# Patient Record
Sex: Female | Born: 2005 | Race: White | Hispanic: No | Marital: Single | Smoking: Never smoker
Health system: Southern US, Community
[De-identification: ages and names within clinical notes are randomized; demographics above are authoritative.]

## PROBLEM LIST (undated history)

## (undated) DIAGNOSIS — I499 Cardiac arrhythmia, unspecified: Secondary | ICD-10-CM

## (undated) DIAGNOSIS — I493 Ventricular premature depolarization: Secondary | ICD-10-CM

## (undated) HISTORY — DX: Cardiac arrhythmia, unspecified: I49.9

## (undated) HISTORY — PX: APPENDECTOMY: SHX54

## (undated) HISTORY — DX: Ventricular premature depolarization: I49.3

---
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
## 2023-02-25 ENCOUNTER — Emergency Department (HOSPITAL_COMMUNITY): Payer: Medicaid Other

## 2023-02-25 ENCOUNTER — Other Ambulatory Visit: Payer: Self-pay

## 2023-02-25 ENCOUNTER — Emergency Department (HOSPITAL_COMMUNITY)
Admission: EM | Admit: 2023-02-25 | Discharge: 2023-02-25 | Disposition: A | Discharge status: Home / Self Care | Payer: Medicaid Other | Attending: Emergency Medicine | Admitting: Emergency Medicine

## 2023-02-25 ENCOUNTER — Encounter (HOSPITAL_COMMUNITY): Payer: Self-pay

## 2023-02-25 DIAGNOSIS — R1030 Lower abdominal pain, unspecified: Secondary | ICD-10-CM | POA: Insufficient documentation

## 2023-02-25 DIAGNOSIS — R109 Unspecified abdominal pain: Secondary | ICD-10-CM | POA: Diagnosis not present

## 2023-02-25 DIAGNOSIS — R112 Nausea with vomiting, unspecified: Secondary | ICD-10-CM

## 2023-02-25 DIAGNOSIS — R1013 Epigastric pain: Secondary | ICD-10-CM | POA: Insufficient documentation

## 2023-02-25 LAB — CBC
HCT: 49.1 % — ABNORMAL HIGH (ref 36.0–46.0)
Hemoglobin: 16.9 g/dL — ABNORMAL HIGH (ref 12.0–15.0)
MCH: 30.7 pg (ref 26.0–34.0)
MCHC: 34.4 g/dL (ref 30.0–36.0)
MCV: 89.1 fL (ref 80.0–100.0)
Platelets: 213 10*3/uL (ref 150–400)
RBC: 5.51 MIL/uL — ABNORMAL HIGH (ref 3.87–5.11)
RDW: 11.7 % (ref 11.5–15.5)
WBC: 10.6 10*3/uL — ABNORMAL HIGH (ref 4.0–10.5)
nRBC: 0 % (ref 0.0–0.2)

## 2023-02-25 LAB — COMPREHENSIVE METABOLIC PANEL
ALT: 17 U/L (ref 0–44)
AST: 21 U/L (ref 15–41)
Albumin: 5.5 g/dL — ABNORMAL HIGH (ref 3.5–5.0)
Alkaline Phosphatase: 58 U/L (ref 38–126)
Anion gap: 14 (ref 5–15)
BUN: 11 mg/dL (ref 6–20)
CO2: 21 mmol/L — ABNORMAL LOW (ref 22–32)
Calcium: 9.8 mg/dL (ref 8.9–10.3)
Chloride: 103 mmol/L (ref 98–111)
Creatinine, Ser: 0.92 mg/dL (ref 0.44–1.00)
GFR, Estimated: >60 mL/min (ref >60)
Glucose, Bld: 107 mg/dL — ABNORMAL HIGH (ref 70–99)
Potassium: 3.6 mmol/L (ref 3.5–5.1)
Sodium: 138 mmol/L (ref 135–145)
Total Bilirubin: 1.1 mg/dL (ref 0.3–1.2)
Total Protein: 8.8 g/dL — ABNORMAL HIGH (ref 6.5–8.1)

## 2023-02-25 LAB — URINALYSIS, ROUTINE W REFLEX MICROSCOPIC
Bilirubin Urine: NEGATIVE
Glucose, UA: NEGATIVE mg/dL
Hgb urine dipstick: NEGATIVE
Ketones, ur: 20 mg/dL — AB
Leukocytes,Ua: NEGATIVE
Nitrite: NEGATIVE
Protein, ur: 30 mg/dL — AB
Specific Gravity, Urine: 1.03 (ref 1.005–1.030)
pH: 5 (ref 5.0–8.0)

## 2023-02-25 LAB — POC URINE PREG, ED: Preg Test, Ur: NEGATIVE

## 2023-02-25 LAB — LIPASE, BLOOD: Lipase: 32 U/L (ref 11–51)

## 2023-02-25 MED ORDER — SODIUM CHLORIDE 0.9 % IV BOLUS
1000.0000 mL | Freq: Once | INTRAVENOUS | Status: AC
  Administered 2023-02-25: 1000 mL via INTRAVENOUS

## 2023-02-25 MED ORDER — ONDANSETRON HCL 4 MG PO TABS: 4.0000 mg | ORAL_TABLET | Freq: Four times a day (QID) | ORAL | 0 refills | Status: DC

## 2023-02-25 MED ORDER — IOHEXOL 300 MG/ML  SOLN
80.0000 mL | Freq: Once | INTRAMUSCULAR | Status: AC | PRN
  Administered 2023-02-25: 80 mL via INTRAVENOUS

## 2023-02-25 MED ORDER — ONDANSETRON HCL 4 MG/2ML IJ SOLN
4.0000 mg | Freq: Once | INTRAMUSCULAR | Status: AC
  Administered 2023-02-25: 4 mg via INTRAVENOUS
  Filled 2023-02-25: qty 2

## 2023-02-25 NOTE — ED Triage Notes (Signed)
Pt c/o emesis since last night. Endorses abdominal pain. States she can not keep anything down and is shaky.

## 2023-02-25 NOTE — Discharge Instructions (Signed)
Small, frequent sips of clear fluids tonight, if improving, bland diet as tolerated starting tomorrow.  Follow-up with your primary care provider for recheck or return to the emergency department for any new or worsening symptoms.

## 2023-02-25 NOTE — ED Provider Notes (Signed)
[place] EMERGENCY DEPARTMENT AT [facility] Provider Note   CSN: [id number] Arrival date & time: 02/25/23  1958     History  Chief Complaint  Patient presents with   Emesis    Sandy Burch is a 17 y.o. female.   Emesis Associated symptoms: abdominal pain   Associated symptoms: no chills, no cough, no diarrhea, no fever and no headaches        Sandy Burch is a 17 y.o. female who presents to the Emergency Department complaining of sudden onset nausea vomiting and lower abdominal pain.  She reports multiple episodes of vomiting and persistent nausea since her symptoms began last evening.  Unable to tolerate any solid foods or liquids.  She describes having cramping pain of her lower abdomen.  She denies any constipation or diarrhea, fever or chills, dysuria, hematemesis, bloody or black stools.    Home Medications Prior to Admission medications   Medication Sig Start Date End Date Taking? Authorizing Provider  doxycycline (VIBRAMYCIN) 100 MG capsule Take 1 capsule (100 mg total) by mouth 2 (two) times daily. 10/01/22   Gareth Eagle, PA-C  esomeprazole (NEXIUM) 20 MG capsule Take 1 capsule (20 mg total) by mouth daily. 09/09/22   Berna Bue, MD      Allergies    Patient has no known allergies.    Review of Systems   Review of Systems  Constitutional:  Negative for appetite change, chills and fever.  Respiratory:  Negative for cough and shortness of breath.   Cardiovascular:  Negative for chest pain.  Gastrointestinal:  Positive for abdominal pain and vomiting. Negative for diarrhea and nausea.  Genitourinary:  Negative for decreased urine volume, dysuria and flank pain.  Musculoskeletal:  Negative for back pain.  Skin:  Negative for rash.  Neurological:  Positive for weakness. Negative for dizziness, numbness and headaches.    Physical Exam Updated Vital Signs BP 106/85   Pulse 90   Temp 98.4 F (36.9 C)   Resp 18   Ht 5\' 3"  (1.6 m)    Wt 63.5 kg   SpO2 97%   BMI 24.80 kg/m  Physical Exam Vitals and nursing note reviewed.  Constitutional:      General: She is not in acute distress.    Appearance: Normal appearance. She is ill-appearing.  HENT:     Mouth/Throat:     Mouth: Mucous membranes are moist.  Cardiovascular:     Rate and Rhythm: Normal rate and regular rhythm.     Pulses: Normal pulses.  Pulmonary:     Effort: Pulmonary effort is normal.  Abdominal:     General: There is no distension.     Palpations: Abdomen is soft.     Tenderness: There is abdominal tenderness. There is no right CVA tenderness, left CVA tenderness or guarding.     Comments: Mild tenderness palpation of the epigastric and lower abdomen.  No guarding or rebound tenderness.  Abdomen is soft  Musculoskeletal:        General: Normal range of motion.  Skin:    General: Skin is warm.     Capillary Refill: Capillary refill takes less than 2 seconds.  Neurological:     General: No focal deficit present.     Mental Status: She is alert.     Sensory: No sensory deficit.     Motor: No weakness.     ED Results / Procedures / Treatments   Labs (all labs ordered are listed, but only abnormal  results are displayed) Labs Reviewed  COMPREHENSIVE METABOLIC PANEL - Abnormal; Notable for the following components:      Result Value   CO2 21 (*)    Glucose, Bld 107 (*)    Total Protein 8.8 (*)    Albumin 5.5 (*)    All other components within normal limits  CBC - Abnormal; Notable for the following components:   WBC 10.6 (*)    RBC 5.51 (*)    Hemoglobin 16.9 (*)    HCT 49.1 (*)    All other components within normal limits  LIPASE, BLOOD  URINALYSIS, ROUTINE W REFLEX MICROSCOPIC  POC URINE PREG, ED    EKG None  Radiology CT ABDOMEN PELVIS W CONTRAST  Result Date: 02/25/2023 CLINICAL DATA:  Right-sided abdominal pain, history of appendectomy EXAM: CT ABDOMEN AND PELVIS WITH CONTRAST TECHNIQUE: Multidetector CT imaging of the abdomen  and pelvis was performed using the standard protocol following bolus administration of intravenous contrast. RADIATION DOSE REDUCTION: This exam was performed according to the departmental dose-optimization program which includes automated exposure control, adjustment of the mA and/or kV according to patient size and/or use of iterative reconstruction technique. CONTRAST:  80mL OMNIPAQUE IOHEXOL 300 MG/ML  SOLN COMPARISON:  None Available. FINDINGS: Lower chest: No acute abnormality. Hepatobiliary: No focal liver abnormality is seen. No gallstones, gallbladder wall thickening, or biliary dilatation. Pancreas: Unremarkable. No pancreatic ductal dilatation or surrounding inflammatory changes. Spleen: Normal in size without focal abnormality. Adrenals/Urinary Tract: Adrenal glands are within normal limits. Kidneys are unremarkable. No renal calculi or obstructive changes are seen. Bladder is decompressed. Stomach/Bowel: The appendix is surgically removed. No obstructive or inflammatory changes of the colon are seen. Stomach and small bowel are within normal limits. Vascular/Lymphatic: No significant vascular findings are present. No enlarged abdominal or pelvic lymph nodes. Reproductive: Uterus is retroflexed. Involuting cyst in the left adnexa is noted. Other: No abdominal wall hernia or abnormality. No abdominopelvic ascites. Musculoskeletal: No acute or significant osseous findings. IMPRESSION: Small involuting cyst in the left adnexa. No other focal abnormality is noted. Electronically Signed   By: Alcide CleverMark  Lukens M.D.   On: 02/25/2023 22:58    Procedures Procedures    Medications Ordered in ED Medications  sodium chloride 0.9 % bolus 1,000 mL (has no administration in time range)  ondansetron (ZOFRAN) injection 4 mg (has no administration in time range)    ED Course/ Medical Decision Making/ A&P                             Medical Decision Making Patient here for evaluation of lower abdominal pain,  nausea, vomiting for 24 hours.  Unable to tolerate liquids or foods.  No reported fever bloody or black stools or hematemesis.  No known sick contacts.  No new medications or foods  Differential would include but not limited to gastroenteritis, SBO, gallbladder process.  Sepsis also considered but felt less likely given reassuring vital signs and patient is afebrile.  Will give NS and antiemetic, labs and CT abdomen pelvis  Amount and/or Complexity of Data Reviewed Labs: ordered.    Details: Labs interpreted by me, no significant leukocytosis, chemistries without derangement.  Urinalysis without evidence of infection.  Urine pregnancy test is negative.  Lipase unremarkable. Radiology: ordered.    Details: CT abdomen pelvis small involuting cyst of the left index, no other focal abnormality Discussion of management or test interpretation with external provider(s): On recheck, patient resting comfortably.  Feeling better.  She has tolerated oral fluid challenge.  No further vomiting.  Workup today reassuring.  Likely viral process.  Risk Prescription drug management.           Final Clinical Impression(s) / ED Diagnoses Final diagnoses:  Nausea and vomiting, unspecified vomiting type    Rx / DC Orders ED Discharge Orders     None         Rosey Bathriplett, Diannah Rindfleisch, PA-C 02/25/23 2310    Rondel BatonPaterson, Robert C, MD 02/26/23 1630

## 2023-04-01 ENCOUNTER — Encounter: Payer: Self-pay | Admitting: Family Medicine

## 2023-04-01 ENCOUNTER — Other Ambulatory Visit (INDEPENDENT_AMBULATORY_CARE_PROVIDER_SITE_OTHER): Payer: Medicaid Other

## 2023-04-01 ENCOUNTER — Ambulatory Visit (INDEPENDENT_AMBULATORY_CARE_PROVIDER_SITE_OTHER): Payer: Medicaid Other | Admitting: Family Medicine

## 2023-04-01 VITALS — BP 101/54 | HR 64 | Temp 98.7°F | Ht 63.0 in | Wt 146.0 lb

## 2023-04-01 DIAGNOSIS — F321 Major depressive disorder, single episode, moderate: Secondary | ICD-10-CM | POA: Diagnosis not present

## 2023-04-01 DIAGNOSIS — Z136 Encounter for screening for cardiovascular disorders: Secondary | ICD-10-CM

## 2023-04-01 DIAGNOSIS — R1084 Generalized abdominal pain: Secondary | ICD-10-CM | POA: Diagnosis not present

## 2023-04-01 DIAGNOSIS — I499 Cardiac arrhythmia, unspecified: Secondary | ICD-10-CM | POA: Diagnosis not present
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
## 2023-04-02 LAB — VITAMIN D 25 HYDROXY (VIT D DEFICIENCY, FRACTURES): Vit D, 25-Hydroxy: 34.9 ng/mL (ref 30.0–100.0)

## 2023-04-02 LAB — ANEMIA PROFILE B
Basophils Absolute: 0 10*3/uL (ref 0.0–0.2)
Basos: 0 %
EOS (ABSOLUTE): 0 10*3/uL (ref 0.0–0.4)
Eos: 1 %
Ferritin: 61 ng/mL (ref 15–77)
Folate: 4.7 ng/mL (ref >3.0)
Hematocrit: 41.7 % (ref 34.0–46.6)
Hemoglobin: 14.2 g/dL (ref 11.1–15.9)
Immature Grans (Abs): 0 10*3/uL (ref 0.0–0.1)
Immature Granulocytes: 0 %
Iron Saturation: 42 % (ref 15–55)
Iron: 109 ug/dL (ref 27–159)
Lymphocytes Absolute: 1.6 10*3/uL (ref 0.7–3.1)
Lymphs: 31 %
MCH: 30.3 pg (ref 26.6–33.0)
MCHC: 34.1 g/dL (ref 31.5–35.7)
MCV: 89 fL (ref 79–97)
Monocytes Absolute: 0.4 10*3/uL (ref 0.1–0.9)
Monocytes: 8 %
Neutrophils Absolute: 3 10*3/uL (ref 1.4–7.0)
Neutrophils: 60 %
Platelets: 131 10*3/uL — ABNORMAL LOW (ref 150–450)
RBC: 4.68 x10E6/uL (ref 3.77–5.28)
RDW: 12.8 % (ref 11.7–15.4)
Retic Ct Pct: 0.7 % (ref 0.6–2.6)
Total Iron Binding Capacity: 261 ug/dL (ref 250–450)
UIBC: 152 ug/dL (ref 131–425)
Vitamin B-12: 206 pg/mL — ABNORMAL LOW (ref 232–1245)
WBC: 5 10*3/uL (ref 3.4–10.8)

## 2023-04-02 LAB — THYROID PANEL WITH TSH
Free Thyroxine Index: 1.7 (ref 1.2–4.9)
T3 Uptake Ratio: 23 % (ref 23–35)
T4, Total: 7.2 ug/dL (ref 4.5–12.0)
TSH: 1.37 u[IU]/mL (ref 0.450–4.500)

## 2023-04-02 LAB — LIPID PANEL
Chol/HDL Ratio: 2.9 ratio (ref 0.0–4.4)
Cholesterol, Total: 130 mg/dL (ref 100–169)
HDL: 45 mg/dL (ref >39)
LDL Chol Calc (NIH): 70 mg/dL (ref 0–109)
Triglycerides: 72 mg/dL (ref 0–89)
VLDL Cholesterol Cal: 15 mg/dL (ref 5–40)

## 2023-04-02 LAB — CMP14+EGFR
ALT: 12 IU/L (ref 0–32)
AST: 14 IU/L (ref 0–40)
Albumin/Globulin Ratio: 1.9 (ref 1.2–2.2)
Albumin: 4.6 g/dL (ref 4.0–5.0)
Alkaline Phosphatase: 67 IU/L (ref 42–106)
BUN/Creatinine Ratio: 6 — ABNORMAL LOW (ref 9–23)
BUN: 5 mg/dL — ABNORMAL LOW (ref 6–20)
Bilirubin Total: 0.6 mg/dL (ref 0.0–1.2)
CO2: 24 mmol/L (ref 20–29)
Calcium: 9.3 mg/dL (ref 8.7–10.2)
Chloride: 102 mmol/L (ref 96–106)
Creatinine, Ser: 0.85 mg/dL (ref 0.57–1.00)
Globulin, Total: 2.4 g/dL (ref 1.5–4.5)
Glucose: 78 mg/dL (ref 70–99)
Potassium: 4.1 mmol/L (ref 3.5–5.2)
Sodium: 138 mmol/L (ref 134–144)
Total Protein: 7 g/dL (ref 6.0–8.5)
eGFR: 102 mL/min/{1.73_m2} (ref >59)

## 2023-04-02 LAB — MAGNESIUM: Magnesium: 2.1 mg/dL (ref 1.6–2.3)

## 2023-04-07 ENCOUNTER — Encounter: Payer: Self-pay | Admitting: Gastroenterology

## 2023-04-07 ENCOUNTER — Ambulatory Visit: Payer: Medicaid Other | Admitting: Gastroenterology

## 2023-04-07 ENCOUNTER — Telehealth: Payer: Self-pay | Admitting: Family Medicine

## 2023-04-14 NOTE — Telephone Encounter (Signed)
RETURNED CALL, NO ANSWER 

## 2023-04-15 ENCOUNTER — Encounter: Payer: Self-pay | Admitting: *Deleted

## 2023-04-20 ENCOUNTER — Encounter: Payer: Self-pay | Admitting: Nurse Practitioner

## 2023-04-20 ENCOUNTER — Ambulatory Visit (INDEPENDENT_AMBULATORY_CARE_PROVIDER_SITE_OTHER): Payer: Medicaid Other | Admitting: Nurse Practitioner

## 2023-04-20 VITALS — BP 95/63 | HR 84 | Temp 97.9°F | Ht 63.0 in | Wt 143.4 lb

## 2023-04-20 DIAGNOSIS — Z3200 Encounter for pregnancy test, result unknown: Secondary | ICD-10-CM

## 2023-04-20 DIAGNOSIS — F321 Major depressive disorder, single episode, moderate: Secondary | ICD-10-CM | POA: Diagnosis not present

## 2023-04-20 DIAGNOSIS — J301 Allergic rhinitis due to pollen: Secondary | ICD-10-CM | POA: Diagnosis not present

## 2023-04-20 DIAGNOSIS — T63301A Toxic effect of unspecified spider venom, accidental (unintentional), initial encounter: Secondary | ICD-10-CM | POA: Diagnosis not present

## 2023-04-20 LAB — PREGNANCY, URINE: Preg Test, Ur: NEGATIVE

## 2023-04-20 MED ORDER — HYDROCORTISONE 1 % EX LOTN
1.0000 | TOPICAL_LOTION | Freq: Two times a day (BID) | CUTANEOUS | 0 refills | Status: DC
Start: 2023-04-20 — End: 2023-09-02

## 2023-04-20 MED ORDER — CETIRIZINE HCL 10 MG PO TABS
10.0000 mg | ORAL_TABLET | Freq: Every day | ORAL | 0 refills | Status: DC
Start: 2023-04-20 — End: 2024-09-27

## 2023-04-20 MED ORDER — FLUOXETINE HCL 10 MG PO CAPS
10.0000 mg | ORAL_CAPSULE | Freq: Every day | ORAL | 2 refills | Status: DC
Start: 2023-04-20 — End: 2023-09-02

## 2023-04-20 NOTE — Progress Notes (Signed)
Acute Office Visit  Subjective:     Patient ID: Sandy Burch, female    DOB: [birth year], 17 y.o.   MRN: [id number]  Chief Complaint  Patient presents with   Insect Bite    Not sure what happened to leg. Noticed spot on leg Thursday/Friday last week. Put triple antibiotic ointment and anti itch cream on spot. Cream only helped with itching some. Spot on left upper thigh. Warm to touch    HPI  Sandy Burch is a 17 year old female seen today as an acute visit for concern for insect bite.  Client reports having spiders and ticks in her yard. Last Thursday she noticed that a rash on her right leg that has increased in size.  She reports itchiness, denies fever. she has been applying antibiotic ointment with no relief. "I am not sure if I got bite by a spider or tick"   She she has past history of allergic rhinitis and reported doing with increased nasal congestion and rhinorrhea for the last month. we discussed starting her on Zyrtec.  She is concerns about her weight last visit 146 lbs today 143 lbs, lost 3lbs since her last visit 04/01/2023 "I go to episodes of not eating for days, not intentionally".  At her last visit she was referred to behavioral health and have not heard from them.  Upon reviewing last visit notes, PHQ-9 was administered, (see EMR) will start Prozac while waiting to be seen by mental. Denies SI/HI.  Sleep fluctuate from sleeping too much or not enough sleep.  LMP 03/24/2023, HCG negative No labs necessary today  ROS Negative unless indicated in HPI    Objective:    BP 95/63   Pulse 84   Temp 97.9 F (36.6 C) (Temporal)   Ht 5\' 3"  (1.6 m)   Wt 143 lb 6.4 oz (65 kg)   LMP 01/31/2023 (Approximate)   SpO2 98%   BMI 25.40 kg/m  Wt Readings from Last 3 Encounters:  04/20/23 143 lb 6.4 oz (65 kg) (76 %, Z= 0.71)*  04/01/23 146 lb (66.2 kg) (79 %, Z= 0.80)*  02/25/23 140 lb (63.5 kg) (73 %, Z= 0.60)*   * Growth percentiles are based on CDC (Girls, 2-20  Years) data.      Physical Exam Vitals and nursing note reviewed.  Constitutional:      General: She is not in acute distress.    Appearance: Normal appearance. She is normal weight.  HENT:     Head: Normocephalic and atraumatic.  Eyes:     General: No scleral icterus.    Extraocular Movements: Extraocular movements intact.     Pupils: Pupils are equal, round, and reactive to light.  Cardiovascular:     Rate and Rhythm: Normal rate.     Heart sounds: Normal heart sounds.  Pulmonary:     Effort: Pulmonary effort is normal.     Breath sounds: Normal breath sounds.  Abdominal:     General: Abdomen is flat. Bowel sounds are normal.     Palpations: Abdomen is soft.  Skin:    General: Skin is warm and dry.     Capillary Refill: Capillary refill takes less than 2 seconds.     Findings: Rash present. Rash is purpuric.       Neurological:     General: No focal deficit present.     Mental Status: She is alert and oriented to person, place, and time. Mental status is at baseline.  Psychiatric:  Attention and Perception: Attention and perception normal.        Mood and Affect: Mood is anxious and depressed.        Speech: Speech normal.        Behavior: Behavior normal. Behavior is cooperative.        Thought Content: Thought content normal. Thought content does not include homicidal or suicidal ideation. Thought content does not include homicidal or suicidal plan.        Cognition and Memory: Cognition and memory normal.        Judgment: Judgment normal.     Results for orders placed or performed in visit on 04/20/23  Pregnancy, urine  Result Value Ref Range   Preg Test, Ur Negative Negative        Assessment & Plan:  Encounter for pregnancy test, result unknown -     Pregnancy, urine -     Hydrocortisone; Apply 1 Application topically 2 (two) times daily.  Dispense: 118 mL; Refill: 0  Spider bite wound, accidental or unintentional, initial encounter -      Hydrocortisone; Apply 1 Application topically 2 (two) times daily.  Dispense: 118 mL; Refill: 0  Major depressive disorder, single episode, moderate (HCC) -     FLUoxetine HCl; Take 1 capsule (10 mg total) by mouth daily.  Dispense: 30 capsule; Refill: 2  Allergic rhinitis due to pollen, unspecified seasonality -     Cetirizine HCl; Take 1 tablet (10 mg total) by mouth daily.  Dispense: 90 tablet; Refill: 0   Assessment/plan MDD Start Prozac 10 mg 1 tab daily while waiting to be seen by behavioral health  Spider bite Hydrocortisone 1% twice a day  Allergic rhinitis Start Cetirizine HCl 10 mg 1 tab daily  Plan discussed with client Follow-up as scheduled or sooner if needed Return for follow-up with PCP as already scheduled.  [address] [place], [place]

## 2023-04-27 NOTE — Progress Notes (Signed)
Will refer to EP given symptoms with ectopy (extra beats). Recommend avoiding stimulants such as caffeine, increasing hydration.

## 2023-04-27 NOTE — Addendum Note (Signed)
Addended by: Neale Burly on: 04/27/2023 05:00 PM   Modules accepted: Orders

## 2023-04-28 ENCOUNTER — Encounter: Payer: Self-pay | Admitting: Gastroenterology

## 2023-05-18 ENCOUNTER — Encounter: Payer: Medicaid Other | Admitting: Family Medicine

## 2023-05-18 ENCOUNTER — Encounter: Payer: Self-pay | Admitting: Family Medicine

## 2023-05-25 ENCOUNTER — Encounter: Payer: Self-pay | Admitting: Internal Medicine

## 2023-05-25 ENCOUNTER — Ambulatory Visit: Payer: Medicaid Other | Attending: Internal Medicine | Admitting: Internal Medicine

## 2023-05-25 VITALS — BP 100/70 | HR 73 | Ht 63.0 in | Wt 146.4 lb

## 2023-05-25 DIAGNOSIS — I493 Ventricular premature depolarization: Secondary | ICD-10-CM

## 2023-05-25 NOTE — Patient Instructions (Addendum)
Medication Instructions:   Continue all current medications.   Labwork:  none  Testing/Procedures:  Your physician has requested that you have an echocardiogram. Echocardiography is a painless test that uses sound waves to create images of your heart. It provides your doctor with information about the size and shape of your heart and how well your heart's chambers and valves are working. This procedure takes approximately one hour. There are no restrictions for this procedure. Please do NOT wear cologne, perfume, aftershave, or lotions (deodorant is allowed). Please arrive 15 minutes prior to your appointment time. Office will contact with results via phone, letter or mychart.     Follow-Up:  6 weeks   Any Other Special Instructions Will Be Listed Below (If Applicable).   If you need a refill on your cardiac medications before your next appointment, please call your pharmacy.  

## 2023-05-25 NOTE — Progress Notes (Signed)
HPI Sandy Burch is referred by Neale Burly, NP for evaluation of PVC's. 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 She notes to have minimal palpitations. She wore a cardiac monitor which demonstrated about 23% PVC's. The patient has never had syncope and the only times she feels her PVC's is when she lies on her left side quietly at night in bed. 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 The patient does not have a family h/o of sudden death.  No Known Allergies   Current Outpatient Medications  Medication Sig Dispense Refill   cetirizine (ZYRTEC) 10 MG tablet Take 1 tablet (10 mg total) by mouth daily. 90 tablet 0   FLUoxetine (PROZAC) 10 MG capsule Take 1 capsule (10 mg total) by mouth daily. (Patient not taking: Reported on 05/25/2023) 30 capsule 2   hydrocortisone 1 % lotion Apply 1 Application topically 2 (two) times daily. (Patient not taking: Reported on 05/25/2023) 118 mL 0   No current facility-administered medications for this visit.     Past Medical History:  Diagnosis Date   Academic underachievement 03/2012   Acute appendicitis with localized peritonitis, without perforation, abscess, or gangrene 06/07/2018   Last Assessment & Plan:  Formatting of this note might be different from the original. Patient is doing well status post laparoscopic appendectomy 05/17/18. No post operative complications apparent. They will resume regular activities at this time   ADHD 07/2010   Allergic rhinitis 03/2009   Constipation 07/2017   Gastroesophageal reflux 09/2015   Hematuria 07/2016   Urology - normal work up   Inadequate sleep hygiene 10/2018   Insomnia 01/2011   Obesity 03/2016    ROS:   All systems reviewed and negative except as noted in the HPI.   Past Surgical History:  Procedure Laterality Date   APPENDECTOMY N/A     [employer] 10/01/2020   LAPAROSCOPIC APPENDECTOMY  08/18/2018   [facility]     Family History  Problem Relation Age of Onset   Hypertension Maternal Grandmother    Heart disease Maternal Grandfather      Social History   Socioeconomic History   Marital status: Single    Spouse name: Not on file   Number of children: Not on file   Years of education: Not on file   Highest education level: Not on file  Occupational History   Not on file  Tobacco Use   Smoking status: Never    Passive exposure: Yes   Smokeless tobacco: Never  Vaping Use   Vaping Use: Never used  Substance and Sexual Activity   Alcohol use: Not Currently   Drug use: Never   Sexual activity: Yes    Birth control/protection: None  Other Topics Concern   Not on file  Social History Narrative   Not on file   Social Determinants of Health   Financial Resource Strain: Not on file  Food Insecurity: Not on file  Transportation Needs: Not on file  Physical Activity: Not on file  Stress: Not on file  Social Connections: Not on file  Intimate Partner Violence: Not on file     BP 100/70 (BP Location: Left Arm, Patient Position: Sitting, Cuff Size: Normal)   Pulse 73   Ht 5\' 3"  (1.6 m)   Wt 146  lb 6.4 oz (66.4 kg)   SpO2 100%   BMI 25.93 kg/m   Physical Exam:  Well appearing 17 yo woman, NAD HEENT: Unremarkable Neck:  No JVD, no thyromegally Lymphatics:  No adenopathy Back:  No CVA tenderness Lungs:  Clear HEART:  IRegular rate rhythm, no murmurs, no rubs, no clicks Abd:  soft, positive bowel sounds, no organomegally, no rebound, no guarding Ext:  2 plus pulses, no edema, no cyanosis, no clubbing Skin:  No rashes no nodules Neuro:  CN II through XII intact, motor grossly intact   Assess/Plan: Mostly asymptomatic PVC's - we discussed the treatment options. I would like to try to rule out a structural heart problem. ARVC is in the differential with PVC's that originate from the RV. A 2D  echo would be her next test. If RV is abnormal then CMRI. As she is minimally symptomatic, would first try a beta blocker to help suppress, followed by flecainide, pending the result of her echo.  Sandy Gowda Hendel Gatliff,MD

## 2023-07-04 ENCOUNTER — Ambulatory Visit (HOSPITAL_COMMUNITY): Admission: RE | Admit: 2023-07-04 | Payer: Medicaid Other | Source: Ambulatory Visit

## 2023-07-05 ENCOUNTER — Ambulatory Visit (HOSPITAL_COMMUNITY): Payer: Medicaid Other | Admitting: Psychiatry

## 2023-07-07 ENCOUNTER — Ambulatory Visit: Payer: Medicaid Other | Attending: Internal Medicine | Admitting: Internal Medicine

## 2023-07-07 VITALS — BP 102/68 | HR 92 | Ht 63.0 in | Wt 147.2 lb

## 2023-07-07 DIAGNOSIS — I493 Ventricular premature depolarization: Secondary | ICD-10-CM

## 2023-07-07 NOTE — Progress Notes (Signed)
HPI Sandy Burch is referred by Neale Burly, NP for evaluation of PVC's. 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 She notes to have minimal palpitations. She wore a cardiac monitor which demonstrated about 23% PVC's. The patient has never had syncope and the only times she feels her PVC's is when she lies on her left side quietly at night in bed. 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 The patient does not have a family h/o of sudden death.  No Known Allergies   Current Outpatient Medications  Medication Sig Dispense Refill   cetirizine (ZYRTEC) 10 MG tablet Take 1 tablet (10 mg total) by mouth daily. 90 tablet 0   FLUoxetine (PROZAC) 10 MG capsule Take 1 capsule (10 mg total) by mouth daily. (Patient not taking: Reported on 05/25/2023) 30 capsule 2   hydrocortisone 1 % lotion Apply 1 Application topically 2 (two) times daily. (Patient not taking: Reported on 05/25/2023) 118 mL 0   No current facility-administered medications for this visit.     Past Medical History:  Diagnosis Date   Academic underachievement 03/2012   Acute appendicitis with localized peritonitis, without perforation, abscess, or gangrene 06/07/2018   Last Assessment & Plan:  Formatting of this note might be different from the original. Patient is doing well status post laparoscopic appendectomy 05/17/18. No post operative complications apparent. They will resume regular activities at this time   ADHD 07/2010   Allergic rhinitis 03/2009   Constipation 07/2017   Gastroesophageal reflux 09/2015   Hematuria 07/2016   Urology - normal work up   Inadequate sleep hygiene 10/2018   Insomnia 01/2011   Obesity 03/2016    ROS:   All systems reviewed and negative except as noted in the HPI.   Past Surgical History:  Procedure Laterality Date   APPENDECTOMY N/A     [employer] 10/01/2020   LAPAROSCOPIC APPENDECTOMY  08/18/2018   [facility]     Family History  Problem Relation Age of Onset   Hypertension Maternal Grandmother    Heart disease Maternal Grandfather      Social History   Socioeconomic History   Marital status: Single    Spouse name: Not on file   Number of children: Not on file   Years of education: Not on file   Highest education level: Not on file  Occupational History   Not on file  Tobacco Use   Smoking status: Never    Passive exposure: Yes   Smokeless tobacco: Never  Vaping Use   Vaping status: Never Used  Substance and Sexual Activity   Alcohol use: Not Currently   Drug use: Never   Sexual activity: Yes    Birth control/protection: None  Other Topics Concern   Not on file  Social History Narrative   Not on file   Social Determinants of Health   Financial Resource Strain: Not on file  Food Insecurity: Not on file  Transportation Needs: Not on file  Physical Activity: Not on file  Stress: Not on file  Social Connections: Not on file  Intimate Partner Violence: Not on file     Ht 5\' 3"  (1.6 m)   Wt 147 lb 3.2 oz (66.8 kg)   BMI 26.08 kg/m   Physical Exam:  Well appearing NAD  HEENT: Unremarkable Neck:  No JVD, no thyromegally Lymphatics:  No adenopathy Back:  No CVA tenderness Lungs:  Clear HEART:  Regular rate rhythm, no murmurs, no rubs, no clicks Abd:  soft, positive bowel sounds, no organomegally, no rebound, no guarding Ext:  2 plus pulses, no edema, no cyanosis, no clubbing Skin:  No rashes no nodules Neuro:  CN II through XII intact, motor grossly intact  EKG  DEVICE  Normal device function.  See PaceArt for details.   Assess/Plan:  Mostly asymptomatic PVC's - we discussed the treatment options. She did not get her echo and is not taking her beta blocker. She feels ok and I have recommended she call us if she starts to feel worse. She is encouraged to avoid stimulants and  ETOH.  Sandy Gowda Kamla Skilton,MD

## 2023-07-07 NOTE — Patient Instructions (Signed)
Medication Instructions:  Your physician recommends that you continue on your current medications as directed. Please refer to the Current Medication list given to you today.  *If you need a refill on your cardiac medications before your next appointment, please call your pharmacy*   Lab Work: NONE   If you have labs (blood work) drawn today and your tests are completely normal, you will receive your results only by: MyChart Message (if you have MyChart) OR A paper copy in the mail If you have any lab test that is abnormal or we need to change your treatment, we will call you to review the results.   Testing/Procedures: NONE    Follow-Up: At [facility], you and your health needs are our priority.  As part of our continuing mission to provide you with exceptional heart care, we have created designated Provider Care Teams.  These Care Teams include your primary Cardiologist (physician) and Advanced Practice Providers (APPs -  Physician Assistants and Nurse Practitioners) who all work together to provide you with the care you need, when you need it.  We recommend signing up for the patient portal called "MyChart".  Sign up information is provided on this After Visit Summary.  MyChart is used to connect with patients for Virtual Visits (Telemedicine).  Patients are able to view lab/test results, encounter notes, upcoming appointments, etc.  Non-urgent messages can be sent to your provider as well.   To learn more about what you can do with MyChart, go to [email or web address].    Your next appointment:    As Needed   Provider:   Gregg Taylor, MD    Other Instructions Thank you for choosing [facility]!    

## 2023-08-03 ENCOUNTER — Ambulatory Visit: Payer: Medicaid Other | Admitting: Gastroenterology
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
## 2023-08-05 ENCOUNTER — Ambulatory Visit (INDEPENDENT_AMBULATORY_CARE_PROVIDER_SITE_OTHER): Payer: Medicaid Other | Admitting: Gastroenterology

## 2023-08-05 ENCOUNTER — Encounter: Payer: Self-pay | Admitting: Gastroenterology

## 2023-08-05 VITALS — BP 112/68 | HR 99 | Ht 62.0 in | Wt 150.0 lb

## 2023-08-05 DIAGNOSIS — K5909 Other constipation: Secondary | ICD-10-CM

## 2023-08-05 DIAGNOSIS — R12 Heartburn: Secondary | ICD-10-CM

## 2023-08-05 DIAGNOSIS — R1013 Epigastric pain: Secondary | ICD-10-CM

## 2023-08-05 NOTE — Progress Notes (Signed)
[facility] Consult Note:  History: Sandy Burch 08/05/2023  Referring provider: Arrie Senate, FNP  Reason for consult/chief complaint: Abdominal Pain (Pt states she has sharp pains in her stomach area that move around.) and Constipation (Pt states she has trouble with moving her bowel every three day.)   Subjective  HPI:  This is a very pleasant 17 year old woman referred by her primary care clinic for abdominal pain.  She describes being troubled by abdominal pain for perhaps 7 or 8 years, followed and worked up in some fashion by her pediatric clinic in [place], [place] (those records not presently available).  She would get episodic fairly generalized abdominal pain without vomiting or dysphagia.  Years ago she would get frequent heartburn but when she was prescribed acid suppression medicine it seemed to make things worse.  Now she gets heartburn episodes perhaps 2 or 3 times a month, but feels they are severe and sometimes as if there is a knifelike pain in the lower chest.  Those symptoms are not exertional and do not seem to be associated with other symptoms.  She has tended toward constipation for many years with a BM every 2 to 3 days on average, tried a few doses of MiraLAX without much improvement.  In the last month or so constipation has worsened where she might have a BM on average once a week.  Denies rectal bleeding.  Saw Cardiology for palpitations and EP found PVCs  Her initial consultation appointment was 2 days ago at this office but she got the time wrong and missed the visit so was rescheduled to today.  ROS:  Review of Systems  Constitutional:  Negative for appetite change and unexpected weight change.  HENT:  Negative for mouth sores and voice change.   Eyes:  Negative for pain and redness.  Respiratory:  Negative for cough and shortness of breath.   Cardiovascular:  Negative for chest pain and palpitations.  Genitourinary:  Negative  for dysuria and hematuria.  Musculoskeletal:  Negative for arthralgias and myalgias.  Skin:  Negative for pallor and rash.  Neurological:  Negative for weakness and headaches.  Hematological:  Negative for adenopathy.  Psychiatric/Behavioral:         Mood stable on current medicine     Past Medical History: Past Medical History:  Diagnosis Date   Academic underachievement 03/2012   Acute appendicitis with localized peritonitis, without perforation, abscess, or gangrene 06/07/2018   Last Assessment & Plan:  Formatting of this note might be different from the original. Patient is doing well status post laparoscopic appendectomy 05/17/18. No post operative complications apparent. They will resume regular activities at this time   ADHD 07/2010   Allergic rhinitis 03/2009   Constipation 07/2017   Gastroesophageal reflux 09/2015   Hematuria 07/2016   Urology - normal work up   Inadequate sleep hygiene 10/2018   Insomnia 01/2011   Irregular heart rhythm    Obesity 03/2016   PVC's (premature ventricular contractions)      Past Surgical History: Past Surgical History:  Procedure Laterality Date   APPENDECTOMY N/A    [employer] 10/01/2020   LAPAROSCOPIC APPENDECTOMY  08/18/2018   [facility]     Family History: Family History  Problem Relation Age of Onset   Hypertension Maternal Grandmother    Heart disease Maternal Grandfather    Liver cancer Neg Hx    Esophageal cancer Neg Hx     Social History: Social History   Socioeconomic History  Marital status: Single    Spouse name: Not on file   Number of children: 0   Years of education: Not on file   Highest education level: Not on file  Occupational History   Occupation: care giver  Tobacco Use   Smoking status: Never    Passive exposure: Yes   Smokeless tobacco: Never  Vaping Use   Vaping status: Never Used  Substance and Sexual Activity   Alcohol use: Not Currently   Drug use: Never   Sexual activity: Yes     Birth control/protection: None  Other Topics Concern   Not on file  Social History Narrative   Not on file   Social Determinants of Health   Financial Resource Strain: Not on file  Food Insecurity: Not on file  Transportation Needs: Not on file  Physical Activity: Not on file  Stress: Not on file  Social Connections: Not on file    Allergies: No Known Allergies  Outpatient Meds: Current Outpatient Medications  Medication Sig Dispense Refill   cetirizine (ZYRTEC) 10 MG tablet Take 1 tablet (10 mg total) by mouth daily. (Patient not taking: Reported on 08/05/2023) 90 tablet 0   FLUoxetine (PROZAC) 10 MG capsule Take 1 capsule (10 mg total) by mouth daily. (Patient not taking: Reported on 08/05/2023) 30 capsule 2   hydrocortisone 1 % lotion Apply 1 Application topically 2 (two) times daily. (Patient not taking: Reported on 08/05/2023) 118 mL 0   No current facility-administered medications for this visit.      ___________________________________________________________________ Objective   Exam:  BP 112/68   Pulse 99   Ht 5\' 2"  (1.575 m)   Wt 150 lb (68 kg)   BMI 27.44 kg/m  Wt Readings from Last 3 Encounters:  08/05/23 150 lb (68 kg) (81%, Z= 0.89)*  07/07/23 147 lb 3.2 oz (66.8 kg) (79%, Z= 0.81)*  05/25/23 146 lb 6.4 oz (66.4 kg) (79%, Z= 0.80)*   * Growth percentiles are based on CDC (Girls, 2-20 Years) data.    General: Well-appearing, pleasant and conversational Eyes: sclera anicteric, no redness ENT: oral mucosa moist without lesions, no cervical or supraclavicular lymphadenopathy CV: Regular without appreciable murmur, no JVD, no peripheral edema Resp: clear to auscultation bilaterally, normal RR and effort noted GI: soft, mild epigastric tenderness, with active bowel sounds. No guarding or palpable organomegaly noted. Skin; warm and dry, no rash or jaundice noted Neuro: awake, alert and oriented x 3. Normal gross motor function and fluent  speech  Labs:     Latest Ref Rng & Units 04/01/2023    2:34 PM 02/25/2023    8:27 PM 02/19/2022    1:48 PM  CBC  WBC 3.4 - 10.8 x10E3/uL 5.0  10.6  4.3   Hemoglobin 11.1 - 15.9 g/dL 16.1  09.6  04.5   Hematocrit 34.0 - 46.6 % 41.7  49.1  41.3   Platelets 150 - 450 x10E3/uL 131  213  154       Latest Ref Rng & Units 04/01/2023    2:34 PM 02/25/2023    8:27 PM 02/19/2022    1:48 PM  CMP  Glucose 70 - 99 mg/dL 78  409  85   BUN 6 - 20 mg/dL 5  11  9    Creatinine 0.57 - 1.00 mg/dL 8.11  9.14  7.82   Sodium 134 - 144 mmol/L 138  138  139   Potassium 3.5 - 5.2 mmol/L 4.1  3.6  3.9   Chloride 96 -  106 mmol/L 102  103  106   CO2 20 - 29 mmol/L 24  21  27    Calcium 8.7 - 10.2 mg/dL 9.3  9.8  8.9   Total Protein 6.0 - 8.5 g/dL 7.0  8.8    Total Bilirubin 0.0 - 1.2 mg/dL 0.6  1.1    Alkaline Phos 42 - 106 IU/L 67  58    AST 0 - 40 IU/L 14  21    ALT 0 - 32 IU/L 12  17     Iron studies normal  Nml TSH/free T4  Neg Urine pregnancy test April and May 2024   Radiologic Studies:   CLINICAL DATA:  Right-sided abdominal pain, history of appendectomy   EXAM: CT ABDOMEN AND PELVIS WITH CONTRAST   TECHNIQUE: Multidetector CT imaging of the abdomen and pelvis was performed using the standard protocol following bolus administration of intravenous contrast.   RADIATION DOSE REDUCTION: This exam was performed according to the departmental dose-optimization program which includes automated exposure control, adjustment of the mA and/or kV according to patient size and/or use of iterative reconstruction technique.   CONTRAST:  80mL OMNIPAQUE IOHEXOL 300 MG/ML  SOLN   COMPARISON:  None Available.   FINDINGS: Lower chest: No acute abnormality.   Hepatobiliary: No focal liver abnormality is seen. No gallstones, gallbladder wall thickening, or biliary dilatation.   Pancreas: Unremarkable. No pancreatic ductal dilatation or surrounding inflammatory changes.   Spleen: Normal in size  without focal abnormality.   Adrenals/Urinary Tract: Adrenal glands are within normal limits. Kidneys are unremarkable. No renal calculi or obstructive changes are seen. Bladder is decompressed.   Stomach/Bowel: The appendix is surgically removed. No obstructive or inflammatory changes of the colon are seen. Stomach and small bowel are within normal limits.   Vascular/Lymphatic: No significant vascular findings are present. No enlarged abdominal or pelvic lymph nodes.   Reproductive: Uterus is retroflexed. Involuting cyst in the left adnexa is noted.   Other: No abdominal wall hernia or abnormality. No abdominopelvic ascites.   Musculoskeletal: No acute or significant osseous findings.   IMPRESSION: Small involuting cyst in the left adnexa. No other focal abnormality is noted.     Electronically Signed   By: Alcide Clever M.D.   On: 02/25/2023 22:58   Assessment: Encounter Diagnoses  Name Primary?   Epigastric pain Yes   Heartburn    Chronic constipation     Somewhat difficult to characterize the abdominal pain because it is sometimes generalized but seems to primarily started in the upper abdomen.  She also now has episodic heartburn that she describes as severe.  If this is reflux, not clear why it is occurring or does not seem to improve with acid suppression therapy.  Raises question of whether could be radiated pain from gallstones.  No gallstones seen on CT abdomen earlier this year, but small noncalcified stones might not be visible on CT.  Chronic constipation that is worsened, not yet clear how or if that is related to the abdominal pain.  The constipation is likely functional in a 17 year old, often dyssynergy defecation.  She has not seen a previous GI physician that she recalls, so no endoscopic or other specialty testing.  No family history of IBD or sprue.  Plan:  Right upper quadrant ultrasound  Take the MiraLAX daily for initial relief of the  constipation.  We can revisit this later to decide if any prescription medicines may be warranted.  Upper endoscopy.  Biopsies will be taken  from the small bowel, stomach and esophagus.  She was agreeable after discussion of procedure and risks.  The benefits and risks of the planned procedure were described in detail with the patient or (when appropriate) their health care proxy.  Risks were outlined as including, but not limited to, bleeding, infection, perforation, adverse medication reaction leading to cardiac or pulmonary decompensation, pancreatitis (if ERCP).  The limitation of incomplete mucosal visualization was also discussed.  No guarantees or warranties were given.   Thank you for the courtesy of this consult.  Please call me with any questions or concerns.  Charlie Pitter III  CC: Referring provider noted above

## 2023-08-05 NOTE — Patient Instructions (Signed)
_______________________________________________________  If your blood pressure at your visit was 140/90 or greater, please contact your primary care physician to follow up on this.  _______________________________________________________  If you are age 19 or older, your body mass index should be between 23-30. Your Body mass index is 27.44 kg/m. If this is out of the aforementioned range listed, please consider follow up with your Primary Care Provider.  If you are age 29 or younger, your body mass index should be between 19-25. Your Body mass index is 27.44 kg/m. If this is out of the aformentioned range listed, please consider follow up with your Primary Care Provider.   ________________________________________________________  Bonita Quin have been scheduled for an endoscopy. Please follow written instructions given to you at your visit today.  If you use inhalers (even only as needed), please bring them with you on the day of your procedure.  If you take any of the following medications, they will need to be adjusted prior to your procedure:   DO NOT TAKE 7 DAYS PRIOR TO TEST- Trulicity (dulaglutide) Ozempic, Wegovy (semaglutide) Mounjaro (tirzepatide) Bydureon Bcise (exanatide extended release)  DO NOT TAKE 1 DAY PRIOR TO YOUR TEST Rybelsus (semaglutide) Adlyxin (lixisenatide) Victoza (liraglutide) Byetta (exanatide) ___________________________________________________________________________     The [facility] providers would like to encourage you to use [facility] to communicate with providers for non-urgent requests or questions.  Due to long hold times on the telephone, sending your provider a message by [facility] may be a faster and more efficient way to get a response.  Please allow 48 business hours for a response.  Please remember that this is for non-urgent requests.  _______________________________________________________  Bonita Quin have been scheduled for an abdominal ultrasound  at [facility] (1st floor of hospital) on 08-10-2023 at 11:00am. Please arrive 30 minutes prior to your appointment for registration. Make certain not to have anything to eat or drink 6 hours prior to your appointment. Should you need to reschedule your appointment, please contact radiology at [phone]. This test typically takes about 30 minutes to perform.  Due to recent changes in healthcare laws, you may see the results of your imaging and laboratory studies on MyChart before your provider has had a chance to review them.  We understand that in some cases there may be results that are confusing or concerning to you. Not all laboratory results come back in the same time frame and the provider may be waiting for multiple results in order to interpret others.  Please give [place] 48 hours in order for your provider to thoroughly review all the results before contacting the office for clarification of your results.   It was a pleasure to see you today!  Thank you for trusting me with your gastrointestinal care!

## 2023-08-06 ENCOUNTER — Encounter: Payer: Self-pay | Admitting: Certified Registered Nurse Anesthetist

## 2023-08-09 ENCOUNTER — Ambulatory Visit (HOSPITAL_COMMUNITY): Admission: RE | Admit: 2023-08-09 | Payer: Medicaid Other | Source: Ambulatory Visit

## 2023-08-10 ENCOUNTER — Encounter: Payer: Self-pay | Admitting: Gastroenterology

## 2023-08-10 ENCOUNTER — Ambulatory Visit: Payer: Medicaid Other | Admitting: Gastroenterology

## 2023-08-10 VITALS — BP 116/69 | HR 99 | Temp 98.9°F | Resp 15 | Ht 62.0 in | Wt 150.0 lb

## 2023-08-10 DIAGNOSIS — K2950 Unspecified chronic gastritis without bleeding: Secondary | ICD-10-CM | POA: Diagnosis not present

## 2023-08-10 DIAGNOSIS — R1013 Epigastric pain: Secondary | ICD-10-CM | POA: Diagnosis not present

## 2023-08-10 DIAGNOSIS — B9681 Helicobacter pylori [H. pylori] as the cause of diseases classified elsewhere: Secondary | ICD-10-CM | POA: Diagnosis not present

## 2023-08-10 MED ORDER — SODIUM CHLORIDE 0.9 % IV SOLN: 500.0000 mL | Freq: Once | INTRAVENOUS | Status: DC

## 2023-08-10 NOTE — Progress Notes (Signed)
1543 Robinul 0.1 mg IV given due large amount of secretions upon assessment.  MD made aware, vss

## 2023-08-10 NOTE — Progress Notes (Signed)
No changes to clinical history since GI office visit on 08/05/23.  The patient is appropriate for an endoscopic procedure in the ambulatory setting.  - Amada Jupiter, MD

## 2023-08-10 NOTE — Patient Instructions (Signed)
YOU HAD AN ENDOSCOPIC PROCEDURE TODAY: Refer to the procedure report and other information in the discharge instructions given to you for any specific questions about what was found during the examination. If this information does not answer your questions, please call [place] office at [phone] to clarify.   YOU SHOULD EXPECT: Some feelings of bloating in the abdomen. Passage of more gas than usual. Walking can help get rid of the air that was put into your GI tract during the procedure and reduce the bloating. If you had a lower endoscopy (such as a colonoscopy or flexible sigmoidoscopy) you may notice spotting of blood in your stool or on the toilet paper. Some abdominal soreness may be present for a day or two, also.  DIET: Your first meal following the procedure should be a light meal and then it is ok to progress to your normal diet. A half-sandwich or bowl of soup is an example of a good first meal. Heavy or fried foods are harder to digest and may make you feel nauseous or bloated. Drink plenty of fluids but you should avoid alcoholic beverages for 24 hours. If you had a esophageal dilation, please see attached instructions for diet.    ACTIVITY: Your care partner should take you home directly after the procedure. You should plan to take it easy, moving slowly for the rest of the day. You can resume normal activity the day after the procedure however YOU SHOULD NOT DRIVE, use power tools, machinery or perform tasks that involve climbing or major physical exertion for 24 hours (because of the sedation medicines used during the test).   SYMPTOMS TO REPORT IMMEDIATELY: A gastroenterologist can be reached at any hour. Please call [phone]  for any of the following symptoms:  Following upper endoscopy (EGD, EUS, ERCP, esophageal dilation) Vomiting of blood or coffee ground material  New, significant abdominal pain  New, significant chest pain or pain under the shoulder blades  Painful or  persistently difficult swallowing  New shortness of breath  Black, tarry-looking or red, bloody stools  FOLLOW UP:  If any biopsies were taken you will be contacted by phone or by letter within the next 1-3 weeks. Call [phone]  if you have not heard about the biopsies in 3 weeks.  Please also call with any specific questions about appointments or follow up tests.

## 2023-08-10 NOTE — Op Note (Signed)
[facility] Patient Name: Sandy Burch Procedure Date: 08/10/2023 3:41 PM MRN: [id number] Endoscopist: Sherilyn Cooter L. Myrtie Neither , MD, [id number] Age: 17 Referring MD:  Date of Birth: Apr 18, 2006 Gender: Female Account #: [id number] Procedure:                Upper GI endoscopy Indications:              Epigastric abdominal pain                           clinical details in 08/05/23 office consult note.                           negative CTAP April 2024 Medicines:                Monitored Anesthesia Care Procedure:                Pre-Anesthesia Assessment:                           - Prior to the procedure, a History and Physical                            was performed, and patient medications and                            allergies were reviewed. The patient's tolerance of                            previous anesthesia was also reviewed. The risks                            and benefits of the procedure and the sedation                            options and risks were discussed with the patient.                            All questions were answered, and informed consent                            was obtained. Prior Anticoagulants: The patient has                            taken no anticoagulant or antiplatelet agents. ASA                            Grade Assessment: I - A normal, healthy patient.                            After reviewing the risks and benefits, the patient                            was deemed in satisfactory condition to undergo the  procedure.                           After obtaining informed consent, the endoscope was                            passed under direct vision. Throughout the                            procedure, the patient's blood pressure, pulse, and                            oxygen saturations were monitored continuously. The                            Olympus Scope [id number] was introduced through the                             mouth, and advanced to the second part of duodenum.                            The upper GI endoscopy was accomplished without                            difficulty. The patient tolerated the procedure                            well. Scope In: Scope Out: Findings:                 The larynx was normal.                           Normal mucosa was found in the entire esophagus.                            Several biopsies were obtained in the middle third                            of the esophagus and in the lower third of the                            esophagus with cold forceps for histology. (r/o                            GERD, EoE)                           Normal mucosa was found in the entire examined                            stomach. Several biopsies were obtained on the                            greater curvature of the gastric body, on the  lesser curvature of the gastric body, on the                            greater curvature of the gastric antrum and on the                            lesser curvature of the gastric antrum with cold                            forceps for histology. (r/o H pylori - all gastric                            Bx in one jar)                           The cardia and gastric fundus were normal on                            retroflexion.                           Normal mucosa was found in the entire duodenum.                            Biopsies for histology were taken with a cold                            forceps for evaluation of celiac disease. Complications:            No immediate complications. Estimated Blood Loss:     Estimated blood loss was minimal. Impression:               - Normal larynx.                           - Normal mucosa was found in the entire esophagus.                           - Normal mucosa was found in the entire stomach.                           - Normal mucosa was found in  the entire examined                            duodenum. Biopsied.                           - Several biopsies were obtained in the middle                            third of the esophagus and in the lower third of                            the esophagus.                           -  Several biopsies were obtained on the greater                            curvature of the gastric body, on the lesser                            curvature of the gastric body, on the greater                            curvature of the gastric antrum and on the lesser                            curvature of the gastric antrum. Recommendation:           - Patient has a contact number available for                            emergencies. The signs and symptoms of potential                            delayed complications were discussed with the                            patient. Return to normal activities tomorrow.                            Written discharge instructions were provided to the                            patient.                           - Resume previous diet.                           - Continue present medications.                           - Await pathology results.                           - Awaiting RUQ U/S to be performed to rule out                            gallstones. Sandy Burch L. Myrtie Neither, MD 08/10/2023 4:01:52 PM This report has been signed electronically.

## 2023-08-11 ENCOUNTER — Telehealth: Payer: Self-pay | Admitting: Gastroenterology

## 2023-08-11 ENCOUNTER — Telehealth: Payer: Self-pay

## 2023-08-11 NOTE — Telephone Encounter (Signed)
Called pt to relay MD recommendations. No answer. Left a detailed message with MD instructions. Instructed pt to call back if pain worsens or is not improving.

## 2023-08-11 NOTE — Telephone Encounter (Signed)
Sorry to hear she is feeling this way.  Yesterday's EGD was a routine procedure, biopsies done and otherwise no interventions taken.  Sounds like esophagus somewhat sensitive to the few biopsies taken, and I expect this will pass.  Can try 1 tablespoon of maalox every 8 hours for next couple days.  - H. Danis

## 2023-08-11 NOTE — Telephone Encounter (Signed)
Spoke with pt: c/o pain 7/10 in esophagus that is a constant burning feeling, which worsens when she swallows. Also states she feels like there is something constantly in her throat. When she takes a deep breathe she feels like someone is standing on her chest. As per note above pt has tried drinking warm liquids with no relief. She was able to eat a small amount of soft food yesterday without N/V. She has only attempted to drink a little water this morning but is still experiencing pain. States the pain is very slightly better than it was yesterday. Encouraged pt to try to drink warm liquids again today to see if she continues to have improvement. Will make MD aware of patient's symptoms and await advise. Pt was scheduled for Abd [place] at [facility] on Tuesday 08/09/23, that appointment shows it was cancelled. Pt states she received no communication about this appointment. Will reach out to office staff to get this rescheduled.

## 2023-08-11 NOTE — Telephone Encounter (Signed)
  Follow up Call-     08/10/2023    3:32 PM  Call back number  Post procedure Call Back phone  # [phone]  Permission to leave phone message Yes    Follow up call, LVM.

## 2023-08-11 NOTE — Telephone Encounter (Signed)
Inbound call from patient returning follow up call from yesterday's procedure 9/18. Patient states when she tries to swallow it is a severe pain and causes her to grab her chest. Patient states she tried to eat soft food last night and drink warm liquids but it was still a lot of pain and patient is still feeling pain this morning. Patient states she was also advised of needing a ultrasound when leaving yesterday. Patient is unsure how to set up ultrasound. Patient requesting a call back. Please advise, thank you.

## 2023-08-18 LAB — SURGICAL PATHOLOGY

## 2023-08-25 ENCOUNTER — Other Ambulatory Visit: Payer: Self-pay

## 2023-08-25 DIAGNOSIS — A048 Other specified bacterial intestinal infections: Secondary | ICD-10-CM

## 2023-08-25 MED ORDER — BISMUTH SUBSALICYLATE 262 MG PO TABS: 2.0000 | ORAL_TABLET | Freq: Four times a day (QID) | ORAL | 0 refills | Status: DC

## 2023-08-25 MED ORDER — METRONIDAZOLE 250 MG PO TABS: 250.0000 mg | ORAL_TABLET | Freq: Four times a day (QID) | ORAL | 0 refills | Status: DC

## 2023-08-25 MED ORDER — OMEPRAZOLE 20 MG PO CPDR
20.0000 mg | DELAYED_RELEASE_CAPSULE | Freq: Two times a day (BID) | ORAL | Status: DC
Start: 2023-08-25 — End: 2024-09-27

## 2023-08-25 MED ORDER — TETRACYCLINE HCL 500 MG PO CAPS
500.0000 mg | ORAL_CAPSULE | Freq: Four times a day (QID) | ORAL | 0 refills | Status: DC
Start: 2023-08-25 — End: 2023-09-02

## 2023-08-31 DIAGNOSIS — Z3201 Encounter for pregnancy test, result positive: Secondary | ICD-10-CM | POA: Diagnosis not present

## 2023-08-31 DIAGNOSIS — N912 Amenorrhea, unspecified: Secondary | ICD-10-CM | POA: Diagnosis not present

## 2023-08-31 DIAGNOSIS — O3680X0 Pregnancy with inconclusive fetal viability, not applicable or unspecified: Secondary | ICD-10-CM | POA: Diagnosis not present

## 2023-08-31 DIAGNOSIS — Z3689 Encounter for other specified antenatal screening: Secondary | ICD-10-CM | POA: Diagnosis not present

## 2023-09-01 ENCOUNTER — Telehealth (HOSPITAL_COMMUNITY): Payer: Self-pay

## 2023-09-01 NOTE — Telephone Encounter (Signed)
Lvm to confirm 09/01/23 appt by 12:00 tomorrow

## 2023-09-02 ENCOUNTER — Encounter (HOSPITAL_COMMUNITY): Payer: Self-pay

## 2023-09-02 ENCOUNTER — Encounter: Payer: Self-pay | Admitting: Family Medicine

## 2023-09-02 ENCOUNTER — Ambulatory Visit: Payer: Medicaid Other | Admitting: Family Medicine

## 2023-09-02 VITALS — BP 100/67 | HR 87 | Temp 98.5°F | Ht 62.0 in | Wt 137.0 lb

## 2023-09-02 DIAGNOSIS — A048 Other specified bacterial intestinal infections: Secondary | ICD-10-CM

## 2023-09-02 DIAGNOSIS — Z23 Encounter for immunization: Secondary | ICD-10-CM

## 2023-09-02 DIAGNOSIS — Z0001 Encounter for general adult medical examination with abnormal findings: Secondary | ICD-10-CM | POA: Diagnosis not present

## 2023-09-02 DIAGNOSIS — F330 Major depressive disorder, recurrent, mild: Secondary | ICD-10-CM

## 2023-09-02 DIAGNOSIS — Z0000 Encounter for general adult medical examination without abnormal findings: Secondary | ICD-10-CM

## 2023-09-02 DIAGNOSIS — Z3A01 Less than 8 weeks gestation of pregnancy: Secondary | ICD-10-CM

## 2023-09-02 NOTE — Patient Instructions (Addendum)
[email or web address]   Dental list          reviewed 03.08.24 Many of these dentists accept Medicaid.  The list is for your convenience in choosing your child's dentist. Estos dentistas aceptan Medicaid.  La lista es para su [place] y es una cortesa.     [facility]     [phone] [address]  [address] [place] [address] Se habla espaol From 15 to 19 years old Parent may go with child Vinson Moselle DDS     [phone] [address]. [place]  [address] Se habla espaol From 32 to 22 years old Parent may NOT go with child  [facility]    [phone] [address] [place] [address] No se habla espaol From birth Parent may not go with child  [facility]     [phone] [address] [place] [address] Se habla espaol From 96 to 9 years old Parent may NOT go with child  Winfield Rast DDS     [phone] [facility]      [address]  [place] [address] No se habla espaol From teeth coming in Parent may go with child  [facility]     [phone] [address] [place]. [place] [address] Requires certification. Call for information. Requiere certificacin. Llame para informacin. Algunos dias se habla espaol  From birth to 20 years Parent possibly goes with child  Bradd Canary DDS     [phone] [id number]  [address] [place] [address] Se habla espaol From 18 months to 18 years  Parent may go with child  J. [place]ac DDS    [phone] Garlon Hatchet DDS [address]. [place] [address] Se habla espaol From 67 year old Parent may go with child  Melynda Ripple DDS    [phone] [address] [place] [address] Se habla espaol  From 35 months old Parent may go with child Dorian Pod DDS    [phone] [address] [place] [address] Se habla espaol From 9 to 32 years old Parent may go with child          Safe Medications in Pregnancy   Acne: Benzoyl Peroxide Salicylic Acid  Backache/Headache: Tylenol: 2 regular strength every 4 hours OR              2 Extra strength every 6 hours  Colds/Coughs/Allergies: Benadryl (alcohol free) 25 mg every 6 hours as needed Breath right strips Claritin Cepacol throat lozenges Chloraseptic throat spray Cold-Eeze- up to three times per day Cough drops, alcohol free Flonase (by prescription only) Guaifenesin Mucinex Robitussin DM (plain only, alcohol free) Saline nasal spray/drops Sudafed (pseudoephedrine) & Actifed ** use only after [redacted] weeks gestation and if you do not have high blood pressure Tylenol Vicks Vaporub Zinc lozenges Zyrtec   Constipation: Colace Ducolax suppositories Fleet enema Glycerin suppositories Metamucil Milk of magnesia Miralax Senokot Smooth move tea  Diarrhea: Kaopectate Imodium A-D  *NO pepto Bismol  Hemorrhoids: Anusol Anusol HC Preparation H Tucks  Indigestion: Tums Maalox Mylanta Zantac  Pepcid  Insomnia: Benadryl (alcohol free) 25mg  every 6 hours as needed Tylenol PM Unisom, no Gelcaps  Leg Cramps: Tums MagGel  Nausea/Vomiting:  Bonine Dramamine Emetrol Ginger extract Sea bands Meclizine  Nausea medication to take during pregnancy:  Unisom (doxylamine succinate 25 mg tablets) Take one tablet daily at bedtime. If symptoms are not adequately controlled, the dose can be  increased to a maximum recommended dose of two tablets daily (1/2 tablet in the morning, 1/2 tablet mid-afternoon and one at bedtime). Vitamin B6 100mg  tablets. Take one tablet twice a day (up to 200 mg per day).  Skin Rashes: Aveeno products Benadryl cream or 25mg  every 6 hours as needed Calamine Lotion 1% cortisone cream  Yeast infection: Gyne-lotrimin 7 Monistat 7   **If taking multiple medications, please check labels to avoid duplicating the same active ingredients **take medication as  directed on the label ** Do not exceed 4000 mg of tylenol in 24 hours **Do not take medications that contain aspirin or ibuprofen

## 2023-09-02 NOTE — Progress Notes (Signed)
Sandy Burch is a 17 y.o. female presents to office today for annual physical exam examination.    Concerns today include: 1. Pregnancy  States that she is surprised but happy. She has self discontinued all medications given pregnancy. States that she is doing well without antidepressant. She does not have any thoughts of self harm. States that it is there, but okay. She is planning to establish with a counselor. She has support of her preacher and his wife.   H pylori  Of note, she recently received EGD and was diagnosed with H. Pylori. She was provided medications and states that she stopped them because she was not sure if they were safe for pregnancy. She still has symptoms of low appetite, bloating.   Occupation: applying for jobs  Marital status: in a relationship  Diet: one meal per day, tacos, sandwich  Exercise: walk Substance use: none - stopped all since pregnancy  Last eye exam: none Last dental exam: none  Last colonoscopy: EGD, no colonoscopy  Contraceptive: currently pregnant Refills needed today: none  Other specialists seen: Ob/gyn, counseling  Dermatology exam: none, declines Fasting today:  no  Immunizations needed: Flu Vaccine: yes  Tdap Vaccine: no  - every 16yrs - (<3 lifetime doses or unknown): all wounds -- look up need for Tetanus IG - (>=3 lifetime doses): clean/minor wound if >66yrs from previous; all other wounds if >106yrs from previous Zoster Vaccine: no (those >50yo, once) Pneumonia Vaccine: no (those w/ risk factors) - (<66yr) Both: Immunocompromised, cochlear implant, CSF leak, asplenic, sickle cell, Chronic Renal Failure - (<76yr) PPSV-23 only: Heart dz, lung disease, DM, tobacco abuse, alcoholism, cirrhosis/liver disease. - (>108yr): PPSV13 then PPSV23 in 6-12mths;  - (>14yr): repeat PPSV23 once if pt received prior to 19yo and 43yrs have passed   Past Medical History:  Diagnosis Date   Academic underachievement 03/2012   Acute appendicitis  with localized peritonitis, without perforation, abscess, or gangrene 06/07/2018   Last Assessment & Plan:  Formatting of this note might be different from the original. Patient is doing well status post laparoscopic appendectomy 05/17/18. No post operative complications apparent. They will resume regular activities at this time   ADHD 07/2010   Allergic rhinitis 03/2009   Constipation 07/2017   Gastroesophageal reflux 09/2015   Hematuria 07/2016   Urology - normal work up   Inadequate sleep hygiene 10/2018   Insomnia 01/2011   Irregular heart rhythm    Obesity 03/2016   PVC's (premature ventricular contractions)    Social History   Socioeconomic History   Marital status: Single    Spouse name: Not on file   Number of children: 0   Years of education: Not on file   Highest education level: Not on file  Occupational History   Occupation: care giver  Tobacco Use   Smoking status: Never    Passive exposure: Yes   Smokeless tobacco: Never  Vaping Use   Vaping status: Never Used  Substance and Sexual Activity   Alcohol use: Not Currently   Drug use: Never   Sexual activity: Yes    Birth control/protection: None  Other Topics Concern   Not on file  Social History Narrative   Not on file   Social Determinants of Health   Financial Resource Strain: Not on file  Food Insecurity: Not on file  Transportation Needs: Not on file  Physical Activity: Not on file  Stress: Not on file  Social Connections: Not on file  Intimate Partner Violence:  Not At Risk (08/31/2023)   [facility]   Humiliation, Afraid, Rape, and Kick questionnaire    Fear of Current or Ex-Partner: No    Emotionally Abused: No    Physically Abused: No    Sexually Abused: No   Past Surgical History:  Procedure Laterality Date   APPENDECTOMY N/A    [employer] 10/01/2020   LAPAROSCOPIC APPENDECTOMY  08/18/2018   [facility]   Family History  Problem Relation Age of Onset   Hypertension  Maternal Grandmother    Heart disease Maternal Grandfather    Liver cancer Neg Hx    Esophageal cancer Neg Hx     Current Outpatient Medications:    Bismuth Subsalicylate 262 MG TABS, Take 2 tablets (524 mg total) by mouth 4 (four) times daily for 14 days. (Patient not taking: Reported on 09/02/2023), Disp: 112 tablet, Rfl: 0   metroNIDAZOLE (FLAGYL) 250 MG tablet, Take 1 tablet (250 mg total) by mouth 4 (four) times daily for 14 days. (Patient not taking: Reported on 09/02/2023), Disp: 56 tablet, Rfl: 0   omeprazole (PRILOSEC) 20 MG capsule, Take 1 capsule (20 mg total) by mouth 2 (two) times daily for 14 days. (Patient not taking: Reported on 09/02/2023), Disp: , Rfl:    tetracycline (SUMYCIN) 500 MG capsule, Take 1 capsule (500 mg total) by mouth 4 (four) times daily for 14 days. (Patient not taking: Reported on 09/02/2023), Disp: 56 capsule, Rfl: 0   cetirizine (ZYRTEC) 10 MG tablet, Take 1 tablet (10 mg total) by mouth daily. (Patient not taking: Reported on 08/05/2023), Disp: 90 tablet, Rfl: 0   FLUoxetine (PROZAC) 10 MG capsule, Take 1 capsule (10 mg total) by mouth daily. (Patient not taking: Reported on 08/05/2023), Disp: 30 capsule, Rfl: 2   hydrocortisone 1 % lotion, Apply 1 Application topically 2 (two) times daily. (Patient not taking: Reported on 08/05/2023), Disp: 118 mL, Rfl: 0  No Known Allergies   ROS: Review of Systems Review of Systems  All other systems reviewed and are negative.   Physical exam    09/02/2023    2:40 PM 08/10/2023    4:20 PM 08/10/2023    4:10 PM  Vitals with BMI  Height 5\' 2"     Weight 137 lbs    BMI 25.05    Systolic 100 116 409  Diastolic 67 69 59  Pulse 87 99 99    Physical Exam Constitutional:      General: She is awake. She is not in acute distress.    Appearance: Normal appearance. She is well-developed, well-groomed and normal weight. She is not ill-appearing, toxic-appearing or diaphoretic.     Interventions: She is not  intubated. HENT:     Right Ear: No laceration, drainage, swelling or tenderness. No middle ear effusion. There is no impacted cerumen. No foreign body. No mastoid tenderness. No PE tube. No hemotympanum. Tympanic membrane is not injected, scarred, perforated, erythematous, retracted or bulging.     Left Ear: No laceration, drainage, swelling or tenderness.  No middle ear effusion. There is no impacted cerumen. No foreign body. No mastoid tenderness. No PE tube. No hemotympanum. Tympanic membrane is not injected, scarred, perforated, erythematous, retracted or bulging.     Nose: Nose normal. No congestion or rhinorrhea.     Right Sinus: No maxillary sinus tenderness or frontal sinus tenderness.     Left Sinus: No maxillary sinus tenderness or frontal sinus tenderness.     Mouth/Throat:     Lips: Pink.  No lesions.     Mouth: Mucous membranes are moist.     Tongue: No lesions.     Palate: No mass.     Pharynx: Oropharynx is clear. No pharyngeal swelling, oropharyngeal exudate, posterior oropharyngeal erythema, uvula swelling or postnasal drip.     Tonsils: No tonsillar exudate or tonsillar abscesses. 2+ on the right. 2+ on the left.  Eyes:     Pupils: Pupils are equal, round, and reactive to light. Pupils are equal.  Neck:     Thyroid: No thyroid mass, thyromegaly or thyroid tenderness.     Trachea: Trachea and phonation normal.  Cardiovascular:     Rate and Rhythm: Normal rate and regular rhythm.     Pulses:          Radial pulses are 2+ on the right side and 2+ on the left side.     Heart sounds: Normal heart sounds.  Pulmonary:     Effort: Pulmonary effort is normal. No tachypnea, bradypnea, accessory muscle usage, prolonged expiration, respiratory distress or retractions. She is not intubated.     Breath sounds: Normal breath sounds. No stridor, decreased air movement or transmitted upper airway sounds. No decreased breath sounds, wheezing, rhonchi or rales.  Abdominal:     General:  Bowel sounds are normal.     Palpations: Abdomen is soft.     Tenderness: There is no abdominal tenderness.     Hernia: No hernia is present.  Musculoskeletal:     Cervical back: Full passive range of motion without pain.     Right lower leg: No edema.     Left lower leg: No edema.  Lymphadenopathy:     Head:     Right side of head: No submental, submandibular, tonsillar, preauricular or posterior auricular adenopathy.     Left side of head: No submental, submandibular, tonsillar, preauricular or posterior auricular adenopathy.     Cervical: No cervical adenopathy.     Right cervical: No superficial or deep cervical adenopathy.    Left cervical: No superficial or deep cervical adenopathy.  Skin:    General: Skin is warm.     Capillary Refill: Capillary refill takes less than 2 seconds.  Neurological:     General: No focal deficit present.     Mental Status: She is alert, oriented to person, place, and time and easily aroused. Mental status is at baseline.  Psychiatric:        Attention and Perception: Attention and perception normal.        Mood and Affect: Mood and affect normal.        Speech: Speech normal.        Behavior: Behavior normal. Behavior is cooperative.        Thought Content: Thought content normal.        Cognition and Memory: Cognition normal.     Assessment/ Plan: Sandy Burch here for annual physical exam.  1. Encounter for general adult medical examination without abnormal findings Discussed with patient to continue healthy lifestyle choices, including diet (rich in fruits, vegetables, and lean proteins, and low in salt and simple carbohydrates) and exercise (at least 30 minutes of moderate physical activity daily). Limit beverages high is sugar. Recommended at least 80-100 oz of water daily.   2. Encounter for immunization - Flu vaccine trivalent PF, 6mos and older(Flulaval,Afluria,Fluarix,Fluzone)  3. Less than [redacted] weeks gestation of  pregnancy Established with OB. Discussed resources with pt. Encouraged pt at length to start prenatal vitamin. Praised  patient on cessation of smoking. Patient to follow up with ob.   4. H. pylori infection Reached out to GI to determine best course of action to treat current H. Pylori infection. Per Up TO Date, patient to wait to treat until at least [redacted] weeks gestation. Will await reply to determine course of action.   5. Mild episode of recurrent major depressive disorder [facility]) Patient self-discontinued medications when she discovered pregnancy. States that she is stable. Does not wish to resume medications at this time. Denies SI. Discussed risks and benefits. Patient to follow up if she wishes to resume. She is starting counseling and plans to start counseling with her preacher and his wife as well.   Patient to follow up in 1 year for annual exam or sooner if needed.  The above assessment and management plan was discussed with the patient. The patient verbalized understanding of and has agreed to the management plan. Patient is aware to call the clinic if symptoms persist or worsen. Patient is aware when to return to the clinic for a follow-up visit. Patient educated on when it is appropriate to go to the emergency department.   Neale Burly, DNP-FNP [facility] [address] [place], [place] [address] [phone]

## 2023-09-02 NOTE — Telephone Encounter (Signed)
Unable to confirm Monday's appt no response on automated system. Cancelling appt mychart message sent

## 2023-09-03 ENCOUNTER — Telehealth: Payer: Self-pay | Admitting: Gastroenterology

## 2023-09-03 NOTE — Telephone Encounter (Signed)
Sandy Burch,   I received a message from this patient's primary care provider that Sandy Burch was recently found to be pregnant.  She had an upper endoscopy with me on 08/10/2023, exam was normal, biopsies positive for H. pylori. She was prescribed bismuth based quadruple therapy, and primary care is questioning whether or not she should take that or an alternate treatment regimen since she is in first trimester pregnancy.  I indicated my result note that it is not clear to what extent, if at all, this H. pylori is related to her reported digestive symptoms, particularly because she had no visible gastritis or ulcer on upper endoscopy.  Therefore, I think the safest thing for her baby would be not to take any antibiotic treatment for this H. pylori throughout the pregnancy.  Please convey that to her.    - H Danis  _________________  Ms Sandy Burch,  Please see above, thanks for the note.  Amada Jupiter, [facility]

## 2023-09-05 ENCOUNTER — Ambulatory Visit (HOSPITAL_COMMUNITY): Payer: Medicaid Other | Admitting: Psychiatry

## 2023-09-05 NOTE — Telephone Encounter (Signed)
Pt was made aware of Dr. Myrtie Neither recommendations: Pt verbalized understanding with all questions answered.

## 2023-09-05 NOTE — Telephone Encounter (Signed)
Please let patient know that GI recommends not treating H pylori during her pregnancy.

## 2023-10-17 DIAGNOSIS — Z3A13 13 weeks gestation of pregnancy: Secondary | ICD-10-CM | POA: Diagnosis not present

## 2023-10-17 DIAGNOSIS — Z3689 Encounter for other specified antenatal screening: Secondary | ICD-10-CM | POA: Diagnosis not present

## 2023-10-17 DIAGNOSIS — Z3401 Encounter for supervision of normal first pregnancy, first trimester: Secondary | ICD-10-CM | POA: Diagnosis not present

## 2023-11-01 ENCOUNTER — Ambulatory Visit: Payer: Medicaid Other | Admitting: Professional Counselor

## 2023-11-01 ENCOUNTER — Telehealth: Payer: Medicaid Other | Admitting: Family Medicine

## 2023-11-01 DIAGNOSIS — Z20822 Contact with and (suspected) exposure to covid-19: Secondary | ICD-10-CM

## 2023-11-01 NOTE — Progress Notes (Signed)
Subjective:    Patient ID: Sandy Burch, female    DOB: Sep 11, 2006, 17 y.o.   MRN: [id number]   HPI: Sandy Burch is a 17 y.o. female presenting for cough, malaise. Grandmother p213ositive for Covid. Fever 100.2. onset was 6 days ago. Came in earlier. Runny nose, sneezing. Denies earache, ST. Pt. Is three months pregnant.        09/02/2023    2:42 PM 04/20/2023   11:48 AM 04/20/2023   11:18 AM 04/01/2023    1:38 PM 09/09/2022   10:39 AM  Depression screen PHQ 2/9  Decreased Interest 1 1 1 1 1   Down, Depressed, Hopeless 2 1 1 1 1   PHQ - 2 Score 3 2 2 2 2   Altered sleeping 3 1 1 2 1   Tired, decreased energy 1 1 1 2 1   Change in appetite 1 3 1 3 1   Feeling bad or failure about yourself  0 0 0 1 0  Trouble concentrating 0 1 1 1  0  Moving slowly or fidgety/restless 0 0 0 0 0  Suicidal thoughts 0 0 0 0   PHQ-9 Score 8 8 6 11 5   Difficult doing work/chores Not difficult at all Somewhat difficult Not difficult at all Somewhat difficult      Relevant past medical, surgical, family and social history reviewed and updated as indicated.  Interim medical history since our last visit reviewed. Allergies and medications reviewed and updated.  ROS:  Review of Systems  Constitutional:  Negative for activity change, appetite change, chills and fever.  HENT:  Positive for congestion, postnasal drip, rhinorrhea and sinus pressure. Negative for ear discharge, ear pain, hearing loss, nosebleeds, sneezing and trouble swallowing.   Respiratory:  Negative for chest tightness and shortness of breath.   Cardiovascular:  Negative for chest pain and palpitations.  Skin:  Negative for rash.     Social History   Tobacco Use  Smoking Status Never   Passive exposure: Yes  Smokeless Tobacco Never       Objective:     Wt Readings from Last 3 Encounters:  09/02/23 137 lb (62.1 kg) (67%, Z= 0.43)*  08/10/23 150 lb (68 kg) (81%, Z= 0.89)*  08/05/23 150 lb (68 kg) (81%, Z= 0.89)*   *  Growth percentiles are based on CDC (Girls, 2-20 Years) data.     Exam deferred. Video visit performed.   Assessment & Plan:   1. Close exposure to COVID-19 virus     No orders of the defined types were placed in this encounter.   Orders Placed This Encounter  Procedures   COVID-19, Flu A+B and RSV    Standing Status:   Future    Number of Occurrences:   1    Expiration Date:   10/31/2024    Previously tested for COVID-19:   Unknown    Resident in a congregate (group) care setting:   Unknown    Is the patient student?:   No    Employed in healthcare setting:   Unknown    Pregnant:   Unknown    Has patient completed COVID vaccination(s) (2 doses of Pfizer/Moderna 1 dose of [employer]):   Unknown    Release to patient:   Immediate [1]      Diagnoses and all orders for this visit:  Close exposure to COVID-19 virus -     COVID-19, Flu A+B and RSV; Future -     COVID-19, Flu A+B and RSV  Virtual Visit  Note  I discussed the limitations, risks, security and privacy concerns of performing an evaluation and management service by video and the availability of in person appointments. The patient was identified with two identifiers. Pt.expressed understanding and agreed to proceed. Pt. Is at home. Dr. Darlyn Read is in his office.  Follow Up Instructions:   I discussed the assessment and treatment plan with the patient. The patient was provided an opportunity to ask questions and all were answered. The patient agreed with the plan and demonstrated an understanding of the instructions.   The patient was advised to call back or seek an in-person evaluation if the symptoms worsen or if the condition fails to improve as anticipated.   Total minutes contact time: 14  Discussion of pregnancy and Covid including unavailability of covid Abx for pregnacy   Follow up plan: Return if symptoms worsen or fail to improve.  Mechele Claude, MD [place] [facility]

## 2023-11-02 LAB — COVID-19, FLU A+B AND RSV
Influenza A, NAA: NOT DETECTED
Influenza B, NAA: NOT DETECTED
RSV, NAA: NOT DETECTED
SARS-CoV-2, NAA: DETECTED — AB

## 2023-11-03 ENCOUNTER — Other Ambulatory Visit: Payer: Self-pay | Admitting: Family Medicine

## 2023-11-03 MED ORDER — NIRMATRELVIR/RITONAVIR (PAXLOVID)TABLET: 3.0000 | ORAL_TABLET | Freq: Two times a day (BID) | ORAL | 0 refills | Status: AC

## 2023-11-04 ENCOUNTER — Encounter: Payer: Self-pay | Admitting: Family Medicine

## 2023-11-29 ENCOUNTER — Ambulatory Visit: Payer: Medicaid Other | Admitting: Nurse Practitioner

## 2023-11-29 NOTE — Progress Notes (Deleted)
     11/29/2023 Sandy Burch [id number] Jan 09, 2006   Chief Complaint:  History of Present Illness: Sandy Burch is a 18 year old female past medical history of palpitations with PVCs, Past surgery eludes a laparoscopic appendectomy 07/2018. She was initially seen by Dr. Margit in office 08/05/2023 due to having generalized abdominal pain with intermittent heartburn and constipation.  She underwent an EGD 08/10/2023, gastric biopsies were positive for H. pylori.  She was initially prescribed bismuth  based quadruple therapy, however, early after she was found to be pregnant in the first trimester and H. pylori treatment was deferred until postpartum.    EGD 08/10/2023: - Normal larynx.  - Normal mucosa was found in the entire esophagus.  - Normal mucosa was found in the entire stomach.  - Normal mucosa was found in the entire examined duodenum. Biopsied.  - Several biopsies were obtained in the middle third of the esophagus and in the lower third of the esophagus.  - Several biopsies were obtained on the greater curvature of the gastric body, on the lesser curvature of the gastric body, on the greater curvature of the gastric antrum and on the lesser curvature of the gastric antrum.     Current Medications, Allergies, Past Medical History, Past Surgical History, Family History and Social History were reviewed in [employer] record.   Review of Systems:   Constitutional: Negative for fever, sweats, chills or weight loss.  Respiratory: Negative for shortness of breath.   Cardiovascular: Negative for chest pain, palpitations and leg swelling.  Gastrointestinal: See HPI.  Musculoskeletal: Negative for back pain or muscle aches.  Neurological: Negative for dizziness, headaches or paresthesias.    Physical Exam: LMP  (LMP Unknown)  General: in no acute distress. Head: Normocephalic and atraumatic. Eyes: No scleral icterus. Conjunctiva pink . Ears: Normal  auditory acuity. Mouth: Dentition intact. No ulcers or lesions.  Lungs: Clear throughout to auscultation. Heart: Regular rate and rhythm, no murmur. Abdomen: Soft, nontender and nondistended. No masses or hepatomegaly. Normal bowel sounds x 4 quadrants.  Rectal: *** Musculoskeletal: Symmetrical with no gross deformities. Extremities: No edema. Neurological: Alert oriented x 4. No focal deficits.  Psychological: Alert and cooperative. Normal mood and affect  Assessment and Recommendations: ***

## 2023-12-13 DIAGNOSIS — Z3689 Encounter for other specified antenatal screening: Secondary | ICD-10-CM | POA: Diagnosis not present

## 2023-12-13 DIAGNOSIS — Z363 Encounter for antenatal screening for malformations: Secondary | ICD-10-CM | POA: Diagnosis not present

## 2023-12-13 DIAGNOSIS — Z3A21 21 weeks gestation of pregnancy: Secondary | ICD-10-CM | POA: Diagnosis not present

## 2024-02-02 DIAGNOSIS — Z3689 Encounter for other specified antenatal screening: Secondary | ICD-10-CM | POA: Diagnosis not present

## 2024-02-02 DIAGNOSIS — Z362 Encounter for other antenatal screening follow-up: Secondary | ICD-10-CM | POA: Diagnosis not present

## 2024-02-02 DIAGNOSIS — Z3A28 28 weeks gestation of pregnancy: Secondary | ICD-10-CM | POA: Diagnosis not present

## 2024-03-14 ENCOUNTER — Telehealth: Payer: Self-pay

## 2024-03-14 NOTE — Telephone Encounter (Signed)
 Received a call to the [facility] office from [facility] inquiring about dental. No answer, left message requesting return call to offer options for dental.   Kris Pester RN [place] Gunn/[facility]

## 2024-03-23 ENCOUNTER — Encounter: Payer: Self-pay | Admitting: *Deleted

## 2024-03-29 DIAGNOSIS — Z3685 Encounter for antenatal screening for Streptococcus B: Secondary | ICD-10-CM | POA: Diagnosis not present

## 2024-03-29 DIAGNOSIS — Z3689 Encounter for other specified antenatal screening: Secondary | ICD-10-CM | POA: Diagnosis not present

## 2024-04-08 DIAGNOSIS — K219 Gastro-esophageal reflux disease without esophagitis: Secondary | ICD-10-CM | POA: Diagnosis not present

## 2024-04-08 DIAGNOSIS — O9081 Anemia of the puerperium: Secondary | ICD-10-CM | POA: Diagnosis not present

## 2024-04-08 DIAGNOSIS — F17290 Nicotine dependence, other tobacco product, uncomplicated: Secondary | ICD-10-CM | POA: Diagnosis not present

## 2024-04-08 DIAGNOSIS — O9962 Diseases of the digestive system complicating childbirth: Secondary | ICD-10-CM | POA: Diagnosis not present

## 2024-04-08 DIAGNOSIS — O99334 Smoking (tobacco) complicating childbirth: Secondary | ICD-10-CM | POA: Diagnosis not present

## 2024-04-08 DIAGNOSIS — Z3A38 38 weeks gestation of pregnancy: Secondary | ICD-10-CM | POA: Diagnosis not present

## 2024-04-08 DIAGNOSIS — D62 Acute posthemorrhagic anemia: Secondary | ICD-10-CM | POA: Diagnosis not present

## 2024-05-22 DIAGNOSIS — Z30430 Encounter for insertion of intrauterine contraceptive device: Secondary | ICD-10-CM | POA: Diagnosis not present

## 2024-05-22 DIAGNOSIS — Z3202 Encounter for pregnancy test, result negative: Secondary | ICD-10-CM | POA: Diagnosis not present

## 2024-09-05 ENCOUNTER — Encounter: Payer: Self-pay | Admitting: Family Medicine

## 2024-09-05 ENCOUNTER — Encounter: Payer: Medicaid Other | Admitting: Family Medicine

## 2024-09-27 ENCOUNTER — Ambulatory Visit: Payer: Self-pay

## 2024-09-27 ENCOUNTER — Encounter: Payer: Self-pay | Admitting: Nurse Practitioner

## 2024-09-27 ENCOUNTER — Ambulatory Visit: Admitting: Nurse Practitioner

## 2024-09-27 VITALS — BP 113/59 | HR 65 | Ht 62.0 in | Wt 161.4 lb

## 2024-09-27 DIAGNOSIS — R051 Acute cough: Secondary | ICD-10-CM

## 2024-09-27 DIAGNOSIS — J0110 Acute frontal sinusitis, unspecified: Secondary | ICD-10-CM

## 2024-09-27 MED ORDER — GUAIFENESIN 400 MG PO TABS: 400.0000 mg | ORAL_TABLET | Freq: Four times a day (QID) | ORAL | 0 refills | Status: AC | PRN

## 2024-09-27 MED ORDER — AMOXICILLIN 875 MG PO TABS: 875.0000 mg | ORAL_TABLET | Freq: Two times a day (BID) | ORAL | 0 refills | Status: AC

## 2024-09-27 MED ORDER — AZELASTINE HCL 0.1 % NA SOLN: 1.0000 | Freq: Two times a day (BID) | NASAL | 5 refills | Status: AC

## 2024-09-27 NOTE — Telephone Encounter (Signed)
 NOTED

## 2024-09-27 NOTE — Telephone Encounter (Signed)
 FYI Only or Action Required?: FYI only for provider: appointment scheduled on today.  Patient was last seen in primary care on 11/01/2023 by Zollie Lowers, MD.  Called Nurse Triage reporting Cough.  Symptoms began 2 weeks ago.  Symptoms are: gradually worsening.  Triage Disposition: See PCP When Office is Open (Within 3 Days)  Patient/caregiver understands and will follow disposition?: Yes     Copied from CRM [id number]. Topic: Clinical - Red Word Triage >> Sep 27, 2024 10:30 AM Tobias CROME wrote: Red Word that prompted transfer to Nurse Triage: Patient has a runny nose, coughing, really bad migraine pain. Productive cough .       Reason for Disposition  [1] Nasal discharge AND [2] present > 10 days  Answer Assessment - Initial Assessment Questions 1. ONSET: When did the cough begin?      2 weeks 2. SEVERITY: How bad is the cough today?      Moderate  3. SPUTUM: Describe the color of your sputum (e.g., none, dry cough; clear, white, yellow, green)     Yellow 4. HEMOPTYSIS: Are you coughing up any blood? If Yes, ask: How much? (e.g., flecks, streaks, tablespoons, etc.)     No 5. DIFFICULTY BREATHING: Are you having difficulty breathing? If Yes, ask: How bad is it? (e.g., mild, moderate, severe)      No 6. FEVER: Do you have a fever? If Yes, ask: What is your temperature, how was it measured, and when did it start?     No 7. CARDIAC HISTORY: Do you have any history of heart disease? (e.g., heart attack, congestive heart failure)      No 8. LUNG HISTORY: Do you have any history of lung disease?  (e.g., pulmonary embolus, asthma, emphysema)     No 9. PE RISK FACTORS: Do you have a history of blood clots? (or: recent major surgery, recent prolonged travel, bedridden)     NO 10. OTHER SYMPTOMS: Do you have any other symptoms? (e.g., runny nose, wheezing, chest pain)       Runny nose, headache  11. PREGNANCY: Is there any chance you are pregnant? When  was your last menstrual period?       No  Protocols used: Cough - Acute Productive-A-AH

## 2024-09-27 NOTE — Progress Notes (Signed)
 Subjective:  Patient ID: Sandy Burch, female    DOB: 29-Sep-2006, 18 y.o.   MRN: [id number]  Patient Care Team: Cathlene Marry Lenis, FNP as PCP - General (Family Medicine) Waddell Danelle ORN, MD as PCP - Electrophysiology (Cardiology)   Chief Complaint:  Cough (X 2 weeks/Rhinorrhea/headache)   HPI: Sandy Burch is a 18 y.o. female presenting on 09/27/2024 for Cough (X 2 weeks/Rhinorrhea/headache)   Discussed the use of AI scribe software for clinical note transcription with the patient, who gave verbal consent to proceed.  History of Present Illness Sandy Burch is a 18 year old female who presents with cough, runny nose, and migraines.  She has been experiencing a productive cough with yellow mucus and yellow nasal discharge for the past two weeks. She has been taking Nyquil without relief. No ear pain, fever, or shortness of breath.  She describes severe headaches, which she refers to as migraines, occurring for about a month. These migraines began approximately a week before her respiratory symptoms. The migraines are debilitating, causing her to stop in her tracks due to the intensity of the pain. She has been using Goody powders for relief, which sometimes help, but not consistently.  She is not breastfeeding and has been drinking plenty of water. She had an IUD removed about a month ago due to discomfort and has not experienced any bleeding since its removal. She is not sexually active and does not believe she is pregnant.      Relevant past medical, surgical, family, and social history reviewed and updated as indicated.  Allergies and medications reviewed and updated. Data reviewed: Chart in Epic.   Past Medical History:  Diagnosis Date   Academic underachievement 03/2012   Acute appendicitis with localized peritonitis, without perforation, abscess, or gangrene 06/07/2018   Last Assessment & Plan:  Formatting of this note might be different from the original.  Patient is doing well status post laparoscopic appendectomy 05/17/18. No post operative complications apparent. They will resume regular activities at this time   ADHD 07/2010   Allergic rhinitis 03/2009   Constipation 07/2017   Gastroesophageal reflux 09/2015   Hematuria 07/2016   Urology - normal work up   Inadequate sleep hygiene 10/2018   Insomnia 01/2011   Irregular heart rhythm    Obesity 03/2016   PVC's (premature ventricular contractions)     Past Surgical History:  Procedure Laterality Date   APPENDECTOMY N/A    [employer] 10/01/2020   LAPAROSCOPIC APPENDECTOMY  08/18/2018   [facility]    Social History   Socioeconomic History   Marital status: Single    Spouse name: Not on file   Number of children: 0   Years of education: Not on file   Highest education level: Not on file  Occupational History   Occupation: care giver  Tobacco Use   Smoking status: Never    Passive exposure: Yes   Smokeless tobacco: Never  Vaping Use   Vaping status: Never Used  Substance and Sexual Activity   Alcohol use: Not Currently   Drug use: Never   Sexual activity: Yes    Birth control/protection: None  Other Topics Concern   Not on file  Social History Narrative   Not on file   Social Drivers of Health   Financial Resource Strain: Low Risk  (04/08/2024)   Received from [facility]   Overall Financial Resource Strain (CARDIA)    Difficulty of Paying Living Expenses: Not hard at all  Food Insecurity: No Food Insecurity (04/08/2024)   Received from [facility]   Hunger Vital Sign    Within the past 12 months, you worried that your food would run out before you got the money to buy more.: Never true    Within the past 12 months, the food you bought just didn't last and you didn't have money to get more.: Never true  Transportation Needs: No Transportation Needs (04/08/2024)   Received from [facility] - Transportation    Lack of Transportation  (Medical): No    Lack of Transportation (Non-Medical): No  Physical Activity: Not on file  Stress: Not on file  Social Connections: Not on file  Intimate Partner Violence: Not At Risk (04/08/2024)   Received from [facility]   Humiliation, Afraid, Rape, and Kick questionnaire    Within the last year, have you been afraid of your partner or ex-partner?: No    Within the last year, have you been humiliated or emotionally abused in other ways by your partner or ex-partner?: No    Within the last year, have you been kicked, hit, slapped, or otherwise physically hurt by your partner or ex-partner?: No    Within the last year, have you been raped or forced to have any kind of sexual activity by your partner or ex-partner?: No    Outpatient Encounter Medications as of 09/27/2024  Medication Sig   amoxicillin (AMOXIL) 875 MG tablet Take 1 tablet (875 mg total) by mouth 2 (two) times daily for 10 days.   azelastine (ASTELIN) 0.1 % nasal spray Place 1 spray into both nostrils 2 (two) times daily. Use in each nostril as directed   guaifenesin (HUMIBID E) 400 MG TABS tablet Take 1 tablet (400 mg total) by mouth every 6 (six) hours as needed.   [DISCONTINUED] cetirizine  (ZYRTEC ) 10 MG tablet Take 1 tablet (10 mg total) by mouth daily. (Patient not taking: Reported on 08/05/2023)   [DISCONTINUED] omeprazole  (PRILOSEC) 20 MG capsule Take 1 capsule (20 mg total) by mouth 2 (two) times daily for 14 days. (Patient not taking: Reported on 09/02/2023)   No facility-administered encounter medications on file as of 09/27/2024.    No Known Allergies  Pertinent ROS per HPI, otherwise unremarkable      Objective:  BP (!) 113/59   Pulse 65   Ht 5' 2 (1.575 m)   Wt 161 lb 6.4 oz (73.2 kg)   SpO2 97%   BMI 29.52 kg/m    Wt Readings from Last 3 Encounters:  09/27/24 161 lb 6.4 oz (73.2 kg)  09/02/23 137 lb (62.1 kg) (67%, Z= 0.43)*  08/10/23 150 lb (68 kg) (81%, Z= 0.89)*   * Growth percentiles are  based on CDC (Girls, 2-20 Years) data.    Physical Exam Vitals and nursing note reviewed.  HENT:     Right Ear: Hearing, tympanic membrane, ear canal and external ear normal.     Left Ear: Hearing, tympanic membrane, ear canal and external ear normal.     Nose: Congestion present.     Right Turbinates: Swollen.     Left Turbinates: Swollen.     Right Sinus: Frontal sinus tenderness present.     Left Sinus: Frontal sinus tenderness present.     Mouth/Throat:     Mouth: Mucous membranes are moist.     Pharynx: Postnasal drip present.  Eyes:     General: No scleral icterus.    Extraocular Movements: Extraocular  movements intact.     Conjunctiva/sclera: Conjunctivae normal.     Pupils: Pupils are equal, round, and reactive to light.  Cardiovascular:     Heart sounds: Normal heart sounds.  Pulmonary:     Effort: Pulmonary effort is normal.     Breath sounds: Normal breath sounds.  Musculoskeletal:        General: Normal range of motion.     Cervical back: Normal range of motion and neck supple. No rigidity or tenderness.     Right lower leg: No edema.     Left lower leg: No edema.  Lymphadenopathy:     Cervical: No cervical adenopathy.  Skin:    General: Skin is warm and dry.     Findings: No rash.  Neurological:     Mental Status: She is oriented to person, place, and time.  Psychiatric:        Mood and Affect: Mood normal.        Behavior: Behavior normal.        Thought Content: Thought content normal.        Judgment: Judgment normal.    Physical Exam      Results for orders placed or performed in visit on 11/01/23  COVID-19, Flu A+B and RSV   Collection Time: 11/01/23  3:31 PM   Specimen: Nasopharyngeal(NP) swabs in vial transport medium  Result Value Ref Range   SARS-CoV-2, NAA Detected (A) Not Detected   Influenza A, NAA Not Detected Not Detected   Influenza B, NAA Not Detected Not Detected   RSV, NAA Not Detected Not Detected   Test Information: Comment         Pertinent labs & imaging results that were available during my care of the patient were reviewed by me and considered in my medical decision making.  Assessment & Plan:  Sandy Burch was seen today for cough.  Diagnoses and all orders for this visit:  Acute non-recurrent frontal sinusitis -     amoxicillin (AMOXIL) 875 MG tablet; Take 1 tablet (875 mg total) by mouth 2 (two) times daily for 10 days. -     azelastine (ASTELIN) 0.1 % nasal spray; Place 1 spray into both nostrils 2 (two) times daily. Use in each nostril as directed  Acute cough -     amoxicillin (AMOXIL) 875 MG tablet; Take 1 tablet (875 mg total) by mouth 2 (two) times daily for 10 days. -     guaifenesin (HUMIBID E) 400 MG TABS tablet; Take 1 tablet (400 mg total) by mouth every 6 (six) hours as needed.     Assessment and Plan Sandy Burch is a 18 year old Caucasian female seen today for URI, no acute distress Assessment & Plan Acute frontal sinusitis - Prescribed amoxicillin 1 tablet twice daily for 10 days. - Advised to take with a full glass of water.  Acute cough Cough with yellow sputum likely related to sinusitis. - Prescribed guaifenesin 400 mg 3 times daily as needed daily until resolution. - Advised to take with a full glass of water.  Migraine Severe headaches consistent with migraines, exacerbated by movement. - Recommended Excedrin Migraine OTC. - Advised to keep a migraine diary. - If ineffective, schedule PCP appointment.      Continue all other maintenance medications.  Follow up plan: Return if symptoms worsen or fail to improve.   Continue healthy lifestyle choices, including diet (rich in fruits, vegetables, and lean proteins, and low in salt and simple carbohydrates) and exercise (at least 30 minutes  of moderate physical activity daily).  Educational handout given for   Clinical References  Cough, Adult A cough helps to clear your throat and lungs. It may be a sign of an illness or  another condition. A short-term (acute) cough may last 2-3 weeks. A long-term (chronic) cough may last 8 or more weeks. Many things can cause a cough. They include: Illnesses such as: An infection in your throat or lungs. Asthma or other heart or lung problems. Gastroesophageal reflux. This is when acid comes back up from your stomach. Breathing in things that bother (irritate) your lungs. Allergies. Postnasal drip. This is when mucus runs down the back of your throat. Smoking. Some medicines. Follow these instructions at home: Medicines Take over-the-counter and prescription medicines only as told by your doctor. Talk with your doctor before you take cough medicine (cough suppressants). Eating and drinking Do not drink alcohol. Do not drink caffeine. Drink enough fluid to keep your pee (urine) pale yellow. Lifestyle Stay away from cigarette smoke. Do not smoke or use any products that contain nicotine or tobacco. If you need help quitting, ask your doctor. Stay away from things that make you cough. These may include perfume, candles, cleaning products, or campfire smoke. General instructions  Watch for any changes to your cough. Tell your doctor about them. Always cover your mouth when you cough. If the air is dry in your home, use a cool mist vaporizer or humidifier. If your cough is worse at night, try using extra pillows to raise your head up higher while you sleep. Rest as needed. Contact a doctor if: You have new symptoms. Your symptoms get worse. You cough up pus. You have a fever that does not go away. Your cough does not get better after 2-3 weeks. Cough medicine does not help, and you are not sleeping well. You have pain that gets worse or is not helped with medicine. You are losing weight and do not know why. You have night sweats. Get help right away if: You cough up blood. You have trouble breathing. Your heart is beating very fast. These symptoms may be an  emergency. Get help right away. Call 911. Do not wait to see if the symptoms will go away. Do not drive yourself to the hospital. This information is not intended to replace advice given to you by your health care provider. Make sure you discuss any questions you have with your health care provider. Document Revised: 07/09/2022 Document Reviewed: 07/09/2022 Elsevier Patient Education  2024 Elsevier Inc. How to Perform a Sinus Rinse A sinus rinse is a home treatment that is used to rinse your sinuses with a germ-free (sterile) mixture of salt and water (saline solution). Sinuses are air-filled spaces in your skull that are behind the bones of your face and forehead. They open into your nasal cavity. A sinus rinse can help to clear mucus, dirt, dust, or pollen from your nasal cavity. You may do a sinus rinse when you have a cold, a virus, nasal allergy symptoms, a sinus infection, or stuffiness in your nose or sinuses. What are the risks? A sinus rinse is generally safe and effective. However, there are a few risks, which include: A burning sensation in your sinuses. This may happen if you do not make the saline solution as directed. Be sure to follow all directions when making the saline solution. Nasal irritation. Infection. This may be from unclean supplies or from contaminated water. Infection from contaminated water is rare, but possible.  Do not do a sinus rinse if you have had ear or nasal surgery, ear infection, or plugged ears, unless recommended by your health care provider. Supplies needed: Saline solution or powder. Distilled or sterile water to mix with saline powder. You may use boiled and cooled tap water. Boil tap water for 5 minutes; cool until it is lukewarm. Use within 24 hours. Do not use regular tap water to mix with the saline solution. Neti pot or nasal rinse bottle. These supplies release the saline solution into your nose and through your sinuses. Neti pots and nasal rinse  bottles can be purchased at charity fundraiser, a health food store, or online. How to perform a sinus rinse  Wash your hands with soap and water for at least 20 seconds. If soap and water are not available, use hand sanitizer. Wash your device according to the directions that came with the product and then dry it. Use the solution that comes with your product or one that is sold separately in stores. Follow the mixing directions on the package to mix with sterile or distilled water. Fill the device with the amount of saline solution noted in the device instructions. Stand by a sink and tilt your head sideways over the sink. Place the spout of the device in your upper nostril (the one closer to the ceiling). Gently pour or squeeze the saline solution into your nasal cavity. The liquid should drain out from the lower nostril if you are not too congested. While rinsing, breathe through your open mouth. Gently blow your nose to clear any mucus and rinse solution. Blowing too hard may cause ear pain. Turn your head in the other direction and repeat in your other nostril. Clean and rinse your device with clean water and then air-dry it. Talk with your health care provider or pharmacist if you have questions about how to do a sinus rinse. Summary A sinus rinse is a home treatment that is used to rinse your sinuses with a sterile mixture of salt and water (saline solution). You may do a sinus rinse when you have a cold, a virus, nasal allergy symptoms, a sinus infection, or stuffiness in your nose or sinuses. A sinus rinse is generally safe and effective. Follow all instructions carefully. This information is not intended to replace advice given to you by your health care provider. Make sure you discuss any questions you have with your health care provider. Document Revised: 04/27/2021 Document Reviewed: 04/27/2021 Elsevier Patient Education  2024 Elsevier Inc.  The above assessment and management  plan was discussed with the patient. The patient verbalized understanding of and has agreed to the management plan. Patient is aware to call the clinic if they develop any new symptoms or if symptoms persist or worsen. Patient is aware when to return to the clinic for a follow-up visit. Patient educated on when it is appropriate to go to the emergency department.   Sandy Agresta [place] Thompson, DNP [facility] [address] [place], [place] [address] [phone]

## 2024-10-25 DIAGNOSIS — Z3A01 Less than 8 weeks gestation of pregnancy: Secondary | ICD-10-CM | POA: Diagnosis not present

## 2024-10-25 DIAGNOSIS — N912 Amenorrhea, unspecified: Secondary | ICD-10-CM | POA: Diagnosis not present

## 2024-10-25 DIAGNOSIS — O3680X1 Pregnancy with inconclusive fetal viability, fetus 1: Secondary | ICD-10-CM | POA: Diagnosis not present

## 2024-10-25 DIAGNOSIS — Z3201 Encounter for pregnancy test, result positive: Secondary | ICD-10-CM | POA: Diagnosis not present

## 2024-10-25 DIAGNOSIS — O3680X0 Pregnancy with inconclusive fetal viability, not applicable or unspecified: Secondary | ICD-10-CM | POA: Diagnosis not present

## 2024-11-28 ENCOUNTER — Ambulatory Visit: Payer: Self-pay

## 2024-11-28 NOTE — Telephone Encounter (Signed)
 Advised  ED

## 2024-11-28 NOTE — Telephone Encounter (Signed)
 FYI Only or Action Required?: FYI only for provider: ED advised.  Patient was last seen in primary care on 09/27/2024 by Deitra Morton Sebastian Nena, NP.  Called Nurse Triage reporting Hip Pain and Abdominal Pain.  Symptoms began several weeks ago.  Interventions attempted: Rest, hydration, or home remedies.  Symptoms are: gradually worsening.  Triage Disposition: Go to ED Now (Notify PCP)  Patient/caregiver understands and will follow disposition?: Yes    2 weeks ago onset of right hip and abdominal pain. Hip pain worse, 6-8/10 aching cramping pain. 5/10 cramping LRQ abdominal pain. Comes and goes. Currently [redacted] weeks pregnant. No vaginal bleeding. Reports falling 2 weeks ago and hitting right side of body. Feeling lightheaded x1.5 weeks. Triage cut short d/t severity of pt symptoms. Advised ED, agreeable to go.    Copied from CRM #[id number]. Topic: Clinical - Red Word Triage >> Nov 28, 2024 11:49 AM Sandy Burch wrote: Red Word that prompted transfer to Nurse Triage: last 2 weeks hip/abdominal has been extreme pain,if pt moves/walk a certain way its hurts. Reason for Disposition  MODERATE-SEVERE abdominal pain (e.g., interferes with normal activities, awakens from sleep)  Feeling weak or lightheaded (e.g., woozy, feeling like they might faint)  Answer Assessment - Initial Assessment Questions 1. LOCATION: Where does it hurt?      RLQ 2. RADIATION: Does the pain shoot anywhere else? (e.g., chest, back, shoulder)     Right hip 3. ONSET: When did the pain begin? (e.g., minutes, hours or days ago)      2 weeks ago 4. ONSET: Gradual or sudden onset?     *No Answer* 5. PATTERN Does the pain come and go, or has it been constant since it started?      Comes and goes 6. SEVERITY: How bad is the pain? What does it keep you from doing?  (e.g., Scale 1-10; mild, moderate, or severe)     5/10 7. RECURRENT SYMPTOM: Have you ever had this type of stomach pain before? If Yes, ask:  When was the last time? and What happened that time?      *No Answer* 8. CAUSE: What do you think is causing the stomach pain?     *No Answer* 9. RELIEVING/AGGRAVATING FACTORS: What makes it better or worse? (e.g., antacids, bowel movement, movement)     *No Answer* 10. OTHER SYMPTOMS: Do you have any other symptoms? (e.g., back pain, diarrhea, fever, urination pain, vaginal bleeding, vaginal discharge, vomiting)       *No Answer* 11. EDD: What date are you expecting to deliver?       *No Answer*  Protocols used: Pregnancy - Abdominal Pain Less Than [redacted] Weeks EGA-A-AH

## 2024-12-09 LAB — PANORAMA PRENATAL TEST FULL PANEL:PANORAMA TEST PLUS 5 ADDITIONAL MICRODELETIONS: FETAL FRACTION: 12.4

## 2024-12-19 ENCOUNTER — Ambulatory Visit: Payer: Self-pay

## 2024-12-19 ENCOUNTER — Inpatient Hospital Stay: Admitting: Family Medicine

## 2024-12-19 NOTE — Telephone Encounter (Signed)
Noted  -LS

## 2024-12-19 NOTE — Telephone Encounter (Signed)
 FYI Only or Action Required?: FYI only for provider: appointment scheduled on 12/19/24.  Patient was last seen in primary care on 09/27/2024 by Deitra Morton Sebastian Nena, NP.  Called Nurse Triage reporting Abdominal Cramping and Hip Pain.  Symptoms began about a month ago.  Interventions attempted: Other: went to ED.  Symptoms are: gradually worsening.  Triage Disposition: See HCP Within 4 Hours (Or PCP Triage)  Patient/caregiver understands and will follow disposition?: Yes     Message from Susanna ORN sent at 12/19/2024 11:01 AM EST  Reason for Triage: Patient states that she's having bad cramps in her lower stomach and above her lower stomach along with her head bothering her. She went to hospital last night and was told she has the flu but states she doesn't think that's what is going on. Patient is also around [redacted] weeks pregnant with second child and wanting to know if she should come in and be seen?   Reason for Disposition  [1] Constant abdominal pain AND [2] present > 2 hours  Answer Assessment - Initial Assessment Questions 1. LOCATION: Where does it hurt?      Lower abdomen (both sides, across entire lower abdomen).  2. RADIATION: Does the pain shoot anywhere else? (e.g., chest, back, shoulder)     No.  3. ONSET: When did the pain begin? (e.g., minutes, hours or days ago)      X 1 month  4. ONSET: Gradual or sudden onset?     Gradual.  5. PATTERN Does the pain come and go, or has it been constant since it started?      Today has been constant for over an hour ago.  6. SEVERITY: How bad is the pain? What does it keep you from doing?  (e.g., Scale 1-10; mild, moderate, or severe)     6-7/10.  7. RECURRENT SYMPTOM: Have you ever had this type of stomach pain before? If Yes, ask: When was the last time? and What happened that time?      No.  8. CAUSE: What do you think is causing the stomach pain?     Was seen at [facility] ED this AM and was tested  and told she is flu A positive, and the flu is causing her pain. She states this pain feels like labor pains.  9. RELIEVING/AGGRAVATING FACTORS: What makes it better or worse? (e.g., antacids, bowel movement, movement)     No.  10. OTHER SYMPTOMS: Do you have any other symptoms? (e.g., back pain, diarrhea, fever, urination pain, vaginal bleeding, vaginal discharge, vomiting)       Had a fall 1 month ago: bilateral hip and right shoulder pain since the fall. Vomiting (mild). Cough.   No LOC/syncope, vaginal bleeding, passing tissue or blood clots, vomiting blood or black, diarrhea, urination pain.   11. EDD: What date are you expecting to deliver?       She states she is around [redacted] weeks pregnant currently.  Protocols used: Pregnancy - Abdominal Pain Less Than [redacted] Weeks EGA-A-AH

## 2025-01-03 ENCOUNTER — Ambulatory Visit

## 2025-02-14 ENCOUNTER — Encounter: Admitting: Family Medicine
